# Patient Record
Sex: Male | Born: 1995 | Race: Black or African American | Hispanic: No | Marital: Single | Smoking: Never smoker
Health system: Southern US, Community
[De-identification: ages and names within clinical notes are randomized; demographics above are authoritative.]

## PROBLEM LIST (undated history)

## (undated) DIAGNOSIS — F909 Attention-deficit hyperactivity disorder, unspecified type: Secondary | ICD-10-CM

---
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
## 2019-01-13 ENCOUNTER — Encounter (HOSPITAL_COMMUNITY): Payer: Self-pay | Admitting: Emergency Medicine

## 2019-01-13 ENCOUNTER — Ambulatory Visit (HOSPITAL_COMMUNITY)
Admission: EM | Admit: 2019-01-13 | Discharge: 2019-01-13 | Disposition: A | Discharge status: Home / Self Care | Payer: Self-pay | Attending: Family Medicine | Admitting: Family Medicine

## 2019-01-13 DIAGNOSIS — L03032 Cellulitis of left toe: Secondary | ICD-10-CM | POA: Insufficient documentation

## 2019-01-13 MED ORDER — MUPIROCIN 2 % EX OINT: 1.0000 "application " | TOPICAL_OINTMENT | Freq: Two times a day (BID) | CUTANEOUS | 0 refills | Status: DC

## 2019-01-13 MED ORDER — CEPHALEXIN 500 MG PO CAPS: 500.0000 mg | ORAL_CAPSULE | Freq: Four times a day (QID) | ORAL | 0 refills | Status: AC

## 2019-01-13 NOTE — ED Provider Notes (Signed)
[place]    CSN: [id number] Arrival date & time: 01/13/19  2355     History   Chief Complaint Chief Complaint  Patient presents with  . Wound Check    HPI Chris Nelson is a 23 y.o. male is no past medical history presenting today for evaluation of toe infection.  Patient states that approximately 1 week ago he pulled a hangnail off.  Since he has developed redness, pain and drainage to his left great toe.  Denies previous issues of similar.  He is not applied anything to his toe or taking anything for his symptoms.  Denies any injury.  Denies difficulty moving ankle or other toes.  Denies numbness or tingling.  HPI  Past Medical History:  Diagnosis Date  . Premature birth     There are no active problems to display for this patient.   History reviewed. No pertinent surgical history.     Home Medications    Prior to Admission medications   Medication Sig Start Date End Date Taking? Authorizing Provider  atomoxetine (STRATTERA) 10 MG capsule Take 10 mg by mouth daily as needed. Takes only when school is in session.    [provider]  cephALEXin (KEFLEX) 500 MG capsule Take 1 capsule (500 mg total) by mouth 4 (four) times daily for 7 days. 01/13/19 01/20/19  Wieters, Hallie C, PA-C  mupirocin ointment (BACTROBAN) 2 % Apply 1 application topically 2 (two) times daily. 01/13/19   Wieters, Elesa Hacker, PA-C    Family History History reviewed. No pertinent family history.  Social History Social History   Tobacco Use  . Smoking status: Not on file  Substance Use Topics  . Alcohol use: Not on file  . Drug use: Not on file     Allergies   Patient has no known allergies.   Review of Systems Review of Systems  Constitutional: Negative for fatigue and fever.  Eyes: Negative for redness, itching and visual disturbance.  Respiratory: Negative for shortness of breath.   Cardiovascular: Negative for chest pain and leg swelling.  Gastrointestinal:  Negative for nausea and vomiting.  Musculoskeletal: Negative for arthralgias and myalgias.  Skin: Positive for color change and wound. Negative for rash.  Neurological: Negative for dizziness, syncope, weakness, light-headedness and headaches.     Physical Exam Triage Vital Signs ED Triage Vitals  Enc Vitals Group     BP 01/13/19 1012 (!) 153/82     Pulse Rate 01/13/19 1012 80     Resp 01/13/19 1012 18     Temp 01/13/19 1012 99 F (37.2 C)     Temp Source 01/13/19 1012 Temporal     SpO2 01/13/19 1012 100 %     Weight --      Height --      Head Circumference --      Peak Flow --      Pain Score 01/13/19 1013 7     Pain Loc --      Pain Edu? --      Excl. in [place]? --    No data found.  Updated Vital Signs BP (!) 153/82 (BP Location: Right Arm)   Pulse 80   Temp 99 F (37.2 C) (Temporal)   Resp 18   SpO2 100%   Visual Acuity Right Eye Distance:   Left Eye Distance:   Bilateral Distance:    Right Eye Near:   Left Eye Near:    Bilateral Near:     Physical  Exam Vitals signs and nursing note reviewed.  Constitutional:      Appearance: He is well-developed.     Comments: No acute distress  HENT:     Head: Normocephalic and atraumatic.     Nose: Nose normal.  Eyes:     Conjunctiva/sclera: Conjunctivae normal.  Neck:     Musculoskeletal: Neck supple.  Cardiovascular:     Rate and Rhythm: Normal rate.  Pulmonary:     Effort: Pulmonary effort is normal. No respiratory distress.  Abdominal:     General: There is no distension.  Musculoskeletal: Normal range of motion.     Comments: Dorsalis pedis 2+  Skin:    General: Skin is warm and dry.     Comments: See picture below, medial aspect of left great toe with large growth, black crusting around this, nail folds with erythema, tenderness and actively draining from proximal nailbed, tender to palpation around this area  Neurological:     Mental Status: He is alert and oriented to person, place, and time.          UC Treatments / Results  Labs (all labs ordered are listed, but only abnormal results are displayed) Labs Reviewed - No data to display  EKG None  Radiology No results found.  Procedures Procedures (including critical care time)  Medications Ordered in UC Medications - No data to display  Initial Impression / Assessment and Plan / UC Course  I have reviewed the triage vital signs and the nursing notes.  Pertinent labs & imaging results that were available during my care of the patient were reviewed by me and considered in my medical decision making (see chart for details).     Patient appears to have paronychia, likely secondary to overgrowth/ingrown toenail.  Will treat infection with Keflex and Bactroban.  Advised to do warm soaks twice daily to help soften up any crusting.  Follow-up with podiatry for further management of this toenail.  Follow-up here if symptoms worsening.Discussed strict return precautions. Patient verbalized understanding and is agreeable with plan.  Final Clinical Impressions(s) / UC Diagnoses   Final diagnoses:  Paronychia of great toe of left foot     Discharge Instructions     Please begin taking Keflex 4 times a day for the next week Please soak your foot in warm water for approximately 15 minutes twice daily, dry really well afterwards and apply Bactroban twice daily  Please follow-up if pain, redness and drainage not resolving Follow-up with podiatry for further management of the abnormality of your toe.   ED Prescriptions    Medication Sig Dispense Auth. Provider   cephALEXin (KEFLEX) 500 MG capsule Take 1 capsule (500 mg total) by mouth 4 (four) times daily for 7 days. 28 capsule Wieters, Hallie C, PA-C   mupirocin ointment (BACTROBAN) 2 % Apply 1 application topically 2 (two) times daily. 22 g Wieters, [place] C, PA-C     Controlled Substance Prescriptions [place] Controlled Substance Registry consulted? Not Applicable    Janith Lima, [place] 01/13/19 1047

## 2019-01-13 NOTE — Discharge Instructions (Signed)
Please begin taking Keflex 4 times a day for the next week Please soak your foot in warm water for approximately 15 minutes twice daily, dry really well afterwards and apply Bactroban twice daily  Please follow-up if pain, redness and drainage not resolving Follow-up with podiatry for further management of the abnormality of your toe.

## 2019-01-13 NOTE — ED Triage Notes (Signed)
Pt here for infection to left great toe after pulling "hang nail" off

## 2020-01-15 ENCOUNTER — Encounter (HOSPITAL_COMMUNITY): Payer: Self-pay

## 2020-01-15 ENCOUNTER — Ambulatory Visit (HOSPITAL_COMMUNITY)
Admission: EM | Admit: 2020-01-15 | Discharge: 2020-01-15 | Disposition: A | Discharge status: Home / Self Care | Payer: Medicaid Other | Attending: Family Medicine | Admitting: Family Medicine

## 2020-01-15 ENCOUNTER — Other Ambulatory Visit: Payer: Self-pay

## 2020-01-15 DIAGNOSIS — K0889 Other specified disorders of teeth and supporting structures: Secondary | ICD-10-CM

## 2020-01-15 MED ORDER — AMOXICILLIN-POT CLAVULANATE 875-125 MG PO TABS: 1.0000 | ORAL_TABLET | Freq: Two times a day (BID) | ORAL | 0 refills | Status: DC

## 2020-01-15 MED ORDER — HYDROCODONE-ACETAMINOPHEN 5-325 MG PO TABS: 1.0000 | ORAL_TABLET | Freq: Four times a day (QID) | ORAL | 0 refills | Status: DC | PRN

## 2020-01-15 NOTE — Discharge Instructions (Addendum)
Be aware, pain medications may cause drowsiness. Please do not drive, operate heavy machinery or make important decisions while on this medication, it can cloud your judgement.  

## 2020-01-15 NOTE — ED Triage Notes (Addendum)
Patient presents to Urgent Care with complaints of dental pain since two weeks ago. Patient reports he has been taking tylenol and ibuprofen, states his whole mouth hurts.  Significant swelling noted on the left side of pt's mouth, pt denies difficulty breathing or controlling secretions.

## 2020-01-20 NOTE — ED Provider Notes (Signed)
[place]   [id number] 01/15/20 Arrival Time: [id number]  ASSESSMENT & PLAN:  1. Pain, dental     Discussed possible sialoadenitis.  No sign of abscess requiring I&D at this time. Discussed.  Begin: Meds ordered this encounter  Medications  . amoxicillin-clavulanate (AUGMENTIN) 875-125 MG tablet    Sig: Take 1 tablet by mouth every 12 (twelve) hours.    Dispense:  20 tablet    Refill:  0  . HYDROcodone-acetaminophen (NORCO/VICODIN) 5-325 MG tablet    Sig: Take 1 tablet by mouth every 6 (six) hours as needed for moderate pain or severe pain.    Dispense:  8 tablet    Refill:  0    [place] Controlled Substances Registry consulted for this patient. I feel the risk/benefit ratio today is favorable for proceeding with this prescription for a controlled substance. Medication sedation precautions given.  Dental resource written instructions given. He will schedule dental evaluation as soon as possible if not improving over the next 24-48 hours.  Reviewed expectations re: course of current medical issues. Questions answered. Outlined signs and symptoms indicating need for more acute intervention. Patient verbalized understanding. After Visit Summary given.   SUBJECTIVE:  Chris Nelson is a 24 y.o. male who reports gradual onset of left lower dental pain described as aching. Present for the past 1-2 weeks. Fever: absent. Tolerating PO intake but reports pain with chewing. Normal swallowing. He does not see a dentist regularly. No neck swelling or pain. OTC analgesics without relief. Has noticed more swelling along L lower jaw the past few days.    OBJECTIVE: Vitals:   01/15/20 1727  BP: (!) 153/98  Pulse: (!) 104  Resp: 16  Temp: 98.7 F (37.1 C)  TempSrc: Oral  SpO2: 100%    General appearance: alert; no distress HENT: normocephalic; atraumatic; dentition: fair; left lower gums without areas of fluctuance, drainage, or bleeding and with tenderness to palpation;  significant swelling under L mandible; normal jaw movement without difficulty CV: slight tachycardia; regular Neck: supple without LAD; FROM; trachea midline Lungs: normal respirations; unlabored Skin: warm and dry Psychological: alert and cooperative; normal mood and affect  No Known Allergies  Past Medical History:  Diagnosis Date  . Premature birth    Social History   Socioeconomic History  . Marital status: Single    Spouse name: Not on file  . Number of children: Not on file  . Years of education: Not on file  . Highest education level: Not on file  Occupational History  . Not on file  Tobacco Use  . Smoking status: Never Smoker  . Smokeless tobacco: Never Used  Substance and Sexual Activity  . Alcohol use: Not Currently  . Drug use: Not on file  . Sexual activity: Not on file  Other Topics Concern  . Not on file  Social History Narrative  . Not on file   Social Determinants of Health   Financial Resource Strain:   . Difficulty of Paying Living Expenses: Not on file  Food Insecurity:   . Worried About Charity fundraiser in the Last Year: Not on file  . Ran Out of Food in the Last Year: Not on file  Transportation Needs:   . Lack of Transportation (Medical): Not on file  . Lack of Transportation (Non-Medical): Not on file  Physical Activity:   . Days of Exercise per Week: Not on file  . Minutes of Exercise per Session: Not on file  Stress:   .  Feeling of Stress : Not on file  Social Connections:   . Frequency of Communication with Friends and Family: Not on file  . Frequency of Social Gatherings with Friends and Family: Not on file  . Attends Religious Services: Not on file  . Active Member of Clubs or Organizations: Not on file  . Attends Archivist Meetings: Not on file  . Marital Status: Not on file  Intimate Partner Violence:   . Fear of Current or Ex-Partner: Not on file  . Emotionally Abused: Not on file  . Physically Abused: Not on  file  . Sexually Abused: Not on file   Family History  Problem Relation Age of Onset  . Hypertension Mother    History reviewed. No pertinent surgical history.   Vanessa Kick, MD 01/20/20 1015

## 2020-12-15 DIAGNOSIS — F3289 Other specified depressive episodes: Secondary | ICD-10-CM | POA: Diagnosis not present

## 2020-12-30 DIAGNOSIS — F3289 Other specified depressive episodes: Secondary | ICD-10-CM | POA: Diagnosis not present

## 2021-01-06 DIAGNOSIS — F3289 Other specified depressive episodes: Secondary | ICD-10-CM | POA: Diagnosis not present

## 2021-01-13 DIAGNOSIS — F3289 Other specified depressive episodes: Secondary | ICD-10-CM | POA: Diagnosis not present

## 2021-01-27 DIAGNOSIS — F3289 Other specified depressive episodes: Secondary | ICD-10-CM | POA: Diagnosis not present

## 2021-02-03 DIAGNOSIS — F3289 Other specified depressive episodes: Secondary | ICD-10-CM | POA: Diagnosis not present

## 2021-02-10 DIAGNOSIS — F3289 Other specified depressive episodes: Secondary | ICD-10-CM | POA: Diagnosis not present

## 2021-02-17 DIAGNOSIS — F3289 Other specified depressive episodes: Secondary | ICD-10-CM | POA: Diagnosis not present

## 2021-02-24 DIAGNOSIS — F3289 Other specified depressive episodes: Secondary | ICD-10-CM | POA: Diagnosis not present

## 2021-03-03 DIAGNOSIS — F3289 Other specified depressive episodes: Secondary | ICD-10-CM | POA: Diagnosis not present

## 2021-03-10 DIAGNOSIS — F3289 Other specified depressive episodes: Secondary | ICD-10-CM | POA: Diagnosis not present

## 2021-03-17 DIAGNOSIS — F3289 Other specified depressive episodes: Secondary | ICD-10-CM | POA: Diagnosis not present

## 2021-03-24 DIAGNOSIS — F3289 Other specified depressive episodes: Secondary | ICD-10-CM | POA: Diagnosis not present

## 2021-03-31 DIAGNOSIS — F3289 Other specified depressive episodes: Secondary | ICD-10-CM | POA: Diagnosis not present

## 2021-04-14 DIAGNOSIS — F3289 Other specified depressive episodes: Secondary | ICD-10-CM | POA: Diagnosis not present

## 2021-04-21 DIAGNOSIS — F3289 Other specified depressive episodes: Secondary | ICD-10-CM | POA: Diagnosis not present

## 2021-05-05 DIAGNOSIS — F3289 Other specified depressive episodes: Secondary | ICD-10-CM | POA: Diagnosis not present

## 2021-05-12 DIAGNOSIS — F3289 Other specified depressive episodes: Secondary | ICD-10-CM | POA: Diagnosis not present

## 2021-05-26 DIAGNOSIS — F3289 Other specified depressive episodes: Secondary | ICD-10-CM | POA: Diagnosis not present

## 2021-06-02 DIAGNOSIS — F3289 Other specified depressive episodes: Secondary | ICD-10-CM | POA: Diagnosis not present

## 2021-06-23 DIAGNOSIS — F3289 Other specified depressive episodes: Secondary | ICD-10-CM | POA: Diagnosis not present

## 2021-06-30 DIAGNOSIS — F3289 Other specified depressive episodes: Secondary | ICD-10-CM | POA: Diagnosis not present

## 2021-07-07 DIAGNOSIS — F3289 Other specified depressive episodes: Secondary | ICD-10-CM | POA: Diagnosis not present

## 2021-07-14 DIAGNOSIS — F3289 Other specified depressive episodes: Secondary | ICD-10-CM | POA: Diagnosis not present

## 2021-07-21 DIAGNOSIS — F3289 Other specified depressive episodes: Secondary | ICD-10-CM | POA: Diagnosis not present

## 2021-07-28 DIAGNOSIS — F3289 Other specified depressive episodes: Secondary | ICD-10-CM | POA: Diagnosis not present

## 2021-08-04 DIAGNOSIS — F3289 Other specified depressive episodes: Secondary | ICD-10-CM | POA: Diagnosis not present

## 2021-08-05 ENCOUNTER — Telehealth: Payer: Self-pay

## 2021-08-05 NOTE — Telephone Encounter (Signed)
.  Mr. Chris Nelson, Chris Nelson are scheduled for a virtual visit with your provider today.    Just as we do with appointments in the office, we must obtain your consent to participate.  Your consent will be active for this visit and any virtual visit you may have with one of our providers in the next 365 days.    If you have a MyChart account, I can also send a copy of this consent to you electronically.  All virtual visits are billed to your insurance company just like a traditional visit in the office.  As this is a virtual visit, video technology does not allow for your provider to perform a traditional examination.  This may limit your provider's ability to fully assess your condition.  If your provider identifies any concerns that need to be evaluated in person or the need to arrange testing such as labs, EKG, etc, we will make arrangements to do so.    Although advances in technology are sophisticated, we cannot ensure that it will always work on either your end or our end.  If the connection with a video visit is poor, we may have to switch to a telephone visit.  With either a video or telephone visit, we are not always able to ensure that we have a secure connection.   I need to obtain your verbal consent now.   Are you willing to proceed with your visit today?   Chris Nelson has provided verbal consent on 08/05/2021 for a virtual visit (video or telephone).   Chris Nelson 08/05/2021  10:51 AM

## 2021-08-06 NOTE — Progress Notes (Signed)
Virtual Visit via Telephone Note  I connected with Chris Nelson, on 08/08/2021 at 11:50 AM by telephone due to the COVID-19 pandemic and verified that I am speaking with the correct person using two identifiers.  Due to current restrictions/limitations of in-office visits due to the COVID-19 pandemic, this scheduled clinical appointment was converted to a telehealth visit.   Consent: I discussed the limitations, risks, security and privacy concerns of performing an evaluation and management service by telephone and the availability of in person appointments. I also discussed with the patient that there may be a patient responsible charge related to this service. The patient expressed understanding and agreed to proceed.   Location of Patient: Home  Location of Provider: [facility] [place] participating in Telemedicine visit: [place], NP Elmon Else, CMA   History of Present Illness: Chris Nelson is a 25 year-old male who presents to establish care.   Current issues and/or concerns: None     Past Medical History:  Diagnosis Date   Premature birth    No Known Allergies  Current Outpatient Medications on File Prior to Visit  Medication Sig Dispense Refill   amoxicillin-clavulanate (AUGMENTIN) 875-125 MG tablet Take 1 tablet by mouth every 12 (twelve) hours. 20 tablet 0   atomoxetine (STRATTERA) 10 MG capsule Take 10 mg by mouth daily as needed. Takes only when school is in session.     HYDROcodone-acetaminophen (NORCO/VICODIN) 5-325 MG tablet Take 1 tablet by mouth every 6 (six) hours as needed for moderate pain or severe pain. 8 tablet 0   mupirocin ointment (BACTROBAN) 2 % Apply 1 application topically 2 (two) times daily. 22 g 0   No current facility-administered medications on file prior to visit.    Observations/Objective: Alert and oriented x 3. Not in acute distress. Physical examination not completed as  this is a telemedicine visit.  Assessment and Plan: 1. Encounter to establish care: - Patient presents today to establish care.  - Return for annual physical examination, labs, and health maintenance. Arrive fasting meaning having no food for at least 8 hours prior to appointment. You may have only water or black coffee. Please take scheduled medications as normal.    Follow Up Instructions: Return for annual physical exam.   Patient was given clear instructions to go to Emergency Department or return to medical center if symptoms don't improve, worsen, or new problems develop.The patient verbalized understanding.  I discussed the assessment and treatment plan with the patient. The patient was provided an opportunity to ask questions and all were answered. The patient agreed with the plan and demonstrated an understanding of the instructions.   The patient was advised to call back or seek an in-person evaluation if the symptoms worsen or if the condition fails to improve as anticipated.    I provided 5 minutes total of non-face-to-face time during this encounter.   Camillia Herter, NP  [facility] [place], [place] 08/08/2021, 11:50 AM

## 2021-08-08 ENCOUNTER — Telehealth (INDEPENDENT_AMBULATORY_CARE_PROVIDER_SITE_OTHER): Payer: Medicaid Other | Admitting: Family

## 2021-08-08 ENCOUNTER — Other Ambulatory Visit: Payer: Self-pay

## 2021-08-08 DIAGNOSIS — Z7689 Persons encountering health services in other specified circumstances: Secondary | ICD-10-CM | POA: Diagnosis not present

## 2021-08-11 DIAGNOSIS — F3289 Other specified depressive episodes: Secondary | ICD-10-CM | POA: Diagnosis not present

## 2021-08-18 DIAGNOSIS — F3289 Other specified depressive episodes: Secondary | ICD-10-CM | POA: Diagnosis not present

## 2021-08-25 DIAGNOSIS — F3289 Other specified depressive episodes: Secondary | ICD-10-CM | POA: Diagnosis not present

## 2021-09-01 DIAGNOSIS — F3289 Other specified depressive episodes: Secondary | ICD-10-CM | POA: Diagnosis not present

## 2021-09-05 ENCOUNTER — Encounter: Payer: Medicaid Other | Admitting: Family

## 2021-09-08 DIAGNOSIS — F3289 Other specified depressive episodes: Secondary | ICD-10-CM | POA: Diagnosis not present

## 2021-09-08 NOTE — Progress Notes (Signed)
Patient ID: HISAO DOO, male    DOB: [birth year]  MRN: [id number]  CC: Annual Physical Exam  Subjective: Chris Nelson is a 25 y.o. male who presents for annual physical exam. He is accompanied by his mother, Judge Stall, who serves as part historian.  His concerns today include:  Reports there is a lump growing on right upper back first noticed 3 months ago. Reports has increased in size since that time. Denies pain, drainage, injury and trauma.   Reports seen over 1 year ago for left great ingrown toenail with infection. Was given antibiotic pills and antibiotic cream at that time. Wants to make sure ok. .  Reports intermittent headaches. Endorses significant screen time. Wears corrective lenses and last eye exam around 2018. Denies any red flag symptoms. Taking ibuprofen usually helps.   Reports diffuse eczema-like rash on generalized body. Denies itching.   Back pain sometimes. Endorses that he does infrequent exercise.   Mother reports patient was a twin. Reports patient's kidneys are located on front of abdomen. Was followed by kidney doctor in the past has been several years since last visit. Reports was always told by pediatricians that he was doing well in regards to kidney concerns.  There are no problems to display for this patient.    Current Outpatient Medications on File Prior to Visit  Medication Sig Dispense Refill   atomoxetine (STRATTERA) 10 MG capsule Take 10 mg by mouth daily as needed. Takes only when school is in session.     No current facility-administered medications on file prior to visit.    No Known Allergies  Social History   Socioeconomic History   Marital status: Single    Spouse name: Not on file   Number of children: Not on file   Years of education: Not on file   Highest education level: Not on file  Occupational History   Not on file  Tobacco Use   Smoking status: Never   Smokeless tobacco: Never  Vaping Use   Vaping Use: Never used   Substance and Sexual Activity   Alcohol use: Not Currently   Drug use: Never   Sexual activity: Not on file  Other Topics Concern   Not on file  Social History Narrative   Not on file   Social Determinants of Health   Financial Resource Strain: Not on file  Food Insecurity: Not on file  Transportation Needs: Not on file  Physical Activity: Not on file  Stress: Not on file  Social Connections: Not on file  Intimate Partner Violence: Not on file    Family History  Problem Relation Age of Onset   Hypertension Mother     History reviewed. No pertinent surgical history.  ROS: Review of Systems Negative except as stated above  PHYSICAL EXAM: BP 130/88 (BP Location: Left Arm, Patient Position: Sitting, Cuff Size: Normal)   Pulse 82   Temp 97.9 F (36.6 C)   Resp 18   Ht 5' 8.78" (1.747 m)   Wt 238 lb 3.2 oz (108 kg)   SpO2 97%   BMI 35.40 kg/m   Physical Exam HENT:     Head: Normocephalic and atraumatic.     Right Ear: Tympanic membrane, ear canal and external ear normal.     Left Ear: Tympanic membrane, ear canal and external ear normal.  Eyes:     Extraocular Movements: Extraocular movements intact.     Conjunctiva/sclera: Conjunctivae normal.     Pupils: Pupils are equal,  round, and reactive to light.  Cardiovascular:     Rate and Rhythm: Normal rate and regular rhythm.     Pulses: Normal pulses.     Heart sounds: Normal heart sounds.  Pulmonary:     Effort: Pulmonary effort is normal.     Breath sounds: Normal breath sounds.  Abdominal:     General: Bowel sounds are normal.     Palpations: Abdomen is soft.  Genitourinary:    Comments: Patient declined exam.  Musculoskeletal:        General: Normal range of motion.     Cervical back: Normal range of motion and neck supple.     Comments: Previously left great toenail partially removed related to hang nail. Bare nailbed remaining with mild erythema.   Skin:    General: Skin is warm and dry.      Capillary Refill: Capillary refill takes less than 2 seconds.     Findings: Rash present.     Comments: Rash similar in appearance to eczema of diffuse generalized body chronic in nature.  Neurological:     General: No focal deficit present.     Mental Status: He is alert and oriented to person, place, and time.  Psychiatric:        Mood and Affect: Mood normal.        Behavior: Behavior normal.    ASSESSMENT AND PLAN: 1. Annual physical exam: - Counseled on 150 minutes of exercise per week as tolerated, healthy eating (including decreased daily intake of saturated fats, cholesterol, added sugars, sodium), STI prevention, and routine healthcare maintenance.  2. Screening for metabolic disorder: - WFU93+ATFT to check kidney function, liver function, and electrolyte balance.  - CMP14+EGFR  3. Screening for deficiency anemia: - CBC to screen for anemia. - CBC  4. Diabetes mellitus screening: - Hemoglobin A1c to screen for pre-diabetes/diabetes. - Hemoglobin A1c  5. Screening cholesterol level: - Lipid panel to screen for high cholesterol.  - Lipid panel  6. Thyroid disorder screen: - TSH to check thyroid function.  - TSH  7. Need for hepatitis C screening test: - Hepatitis C antibody to screen for hepatitis C.  - Hepatitis C Antibody  8. Encounter for screening for HIV: - HIV antibody to screen for human immunodeficiency virus.  - HIV antibody (with reflex)  9. Lipoma of torso: - Referral to Plastic Surgery for further evaluation and management.  - Follow-up with primary provider as scheduled.  - Ambulatory referral to Plastic Surgery  10. Paronychia of great toe, left: - Mupirocin cream as prescribed.  - Referral to Podiatry for further evaluation and management.  - Follow-up with primary provider as scheduled.  - mupirocin cream (BACTROBAN) 2 %; Apply 1 application topically 2 (two) times daily.  Dispense: 30 g; Refill: 2 - Ambulatory referral to Podiatry  11.  Chronic nonintractable headache, unspecified headache type: - No evidence of red flag symptoms present. Continue over-the-counter Ibuprofen and Acetaminophen. Encouraged to schedule annual eye exam, already established with an external provider.  - Follow-up with primary provider as scheduled.   12. Rash and nonspecific skin eruption: - Referral to Dermatology for further evaluation and management.  - Ambulatory referral to Dermatology    Patient was given the opportunity to ask questions.  Patient verbalized understanding of the plan and was able to repeat key elements of the plan. Patient was given clear instructions to go to Emergency Department or return to medical center if symptoms don't improve, worsen, or new problems develop.The  patient verbalized understanding.   Orders Placed This Encounter  Procedures   HIV antibody (with reflex)   Hepatitis C Antibody   CBC   Lipid panel   TSH   CMP14+EGFR   Hemoglobin A1c   Ambulatory referral to Podiatry   Ambulatory referral to Plastic Surgery   Ambulatory referral to Dermatology     Requested Prescriptions   Signed Prescriptions Disp Refills   mupirocin cream (BACTROBAN) 2 % 30 g 2    Sig: Apply 1 application topically 2 (two) times daily.   Follow-up with primary provider as scheduled.   Camillia Herter, NP

## 2021-09-09 ENCOUNTER — Other Ambulatory Visit: Payer: Self-pay

## 2021-09-12 ENCOUNTER — Ambulatory Visit (INDEPENDENT_AMBULATORY_CARE_PROVIDER_SITE_OTHER): Payer: Medicaid Other | Admitting: Family

## 2021-09-12 ENCOUNTER — Other Ambulatory Visit: Payer: Self-pay

## 2021-09-12 ENCOUNTER — Encounter: Payer: Self-pay | Admitting: Family

## 2021-09-12 VITALS — BP 130/88 | HR 82 | Temp 97.9°F | Resp 18 | Ht 68.78 in | Wt 238.2 lb

## 2021-09-12 DIAGNOSIS — D171 Benign lipomatous neoplasm of skin and subcutaneous tissue of trunk: Secondary | ICD-10-CM

## 2021-09-12 DIAGNOSIS — Z0000 Encounter for general adult medical examination without abnormal findings: Secondary | ICD-10-CM

## 2021-09-12 DIAGNOSIS — Z1159 Encounter for screening for other viral diseases: Secondary | ICD-10-CM

## 2021-09-12 DIAGNOSIS — R519 Headache, unspecified: Secondary | ICD-10-CM | POA: Diagnosis not present

## 2021-09-12 DIAGNOSIS — L03032 Cellulitis of left toe: Secondary | ICD-10-CM | POA: Diagnosis not present

## 2021-09-12 DIAGNOSIS — G8929 Other chronic pain: Secondary | ICD-10-CM

## 2021-09-12 DIAGNOSIS — Z131 Encounter for screening for diabetes mellitus: Secondary | ICD-10-CM | POA: Diagnosis not present

## 2021-09-12 DIAGNOSIS — Z1329 Encounter for screening for other suspected endocrine disorder: Secondary | ICD-10-CM | POA: Diagnosis not present

## 2021-09-12 DIAGNOSIS — Z130 Encounter for screening for diseases of the blood and blood-forming organs and certain disorders involving the immune mechanism: Secondary | ICD-10-CM | POA: Diagnosis not present

## 2021-09-12 DIAGNOSIS — Z13228 Encounter for screening for other metabolic disorders: Secondary | ICD-10-CM | POA: Diagnosis not present

## 2021-09-12 DIAGNOSIS — Z114 Encounter for screening for human immunodeficiency virus [HIV]: Secondary | ICD-10-CM

## 2021-09-12 DIAGNOSIS — R21 Rash and other nonspecific skin eruption: Secondary | ICD-10-CM

## 2021-09-12 DIAGNOSIS — Z13220 Encounter for screening for lipoid disorders: Secondary | ICD-10-CM

## 2021-09-12 MED ORDER — MUPIROCIN CALCIUM 2 % EX CREA: 1.0000 "application " | TOPICAL_CREAM | Freq: Two times a day (BID) | CUTANEOUS | 2 refills | Status: DC

## 2021-09-12 NOTE — Progress Notes (Signed)
Pt presents for annual physical exam, accompanied by mother Carleen ,pt reports lump on back, growing in size started approx 3 months ago, denies any pain Left foot big toe pulled hang nail

## 2021-09-12 NOTE — Patient Instructions (Signed)
Preventive Care 36-23 Years Old, Male Preventive care refers to lifestyle choices and visits with your health care provider that can promote health and wellness. This includes: A yearly physical exam. This is also called an annual wellness visit. Regular dental and eye exams. Immunizations. Screening for certain conditions. Healthy lifestyle choices, such as: Eating a healthy diet. Getting regular exercise. Not using drugs or products that contain nicotine and tobacco. Limiting alcohol use. What can I expect for my preventive care visit? Physical exam Your health care provider may check your: Height and weight. These may be used to calculate your BMI (body mass index). BMI is a measurement that tells if you are at a healthy weight. Heart rate and blood pressure. Body temperature. Skin for abnormal spots. Counseling Your health care provider may ask you questions about your: Past medical problems. Family's medical history. Alcohol, tobacco, and drug use. Emotional well-being. Home life and relationship well-being. Sexual activity. Diet, exercise, and sleep habits. Work and work Statistician. Access to firearms. What immunizations do I need? Vaccines are usually given at various ages, according to a schedule. Your health care provider will recommend vaccines for you based on your age, medical history, and lifestyle or other factors, such as travel or where you work. What tests do I need? Blood tests Lipid and cholesterol levels. These may be checked every 5 years starting at age 61. Hepatitis C test. Hepatitis B test. Screening  Diabetes screening. This is done by checking your blood sugar (glucose) after you have not eaten for a while (fasting). Genital exam to check for testicular cancer or hernias. STD (sexually transmitted disease) testing, if you are at risk. Talk with your health care provider about your test results, treatment options, and if necessary, the need for more  tests. Follow these instructions at home: Eating and drinking  Eat a healthy diet that includes fresh fruits and vegetables, whole grains, lean protein, and low-fat dairy products. Drink enough fluid to keep your urine pale yellow. Take vitamin and mineral supplements as recommended by your health care provider. Do not drink alcohol if your health care provider tells you not to drink. If you drink alcohol: Limit how much you have to 0-2 drinks a day. Be aware of how much alcohol is in your drink. In the [place] one drink equals one 12 oz bottle of beer (355 mL), one 5 oz glass of wine (148 mL), or one 1 oz glass of hard liquor (44 mL). Lifestyle Take daily care of your teeth and gums. Brush your teeth every morning and night with fluoride toothpaste. Floss one time each day. Stay active. Exercise for at least 30 minutes 5 or more days each week. Do not use any products that contain nicotine or tobacco, such as cigarettes, e-cigarettes, and chewing tobacco. If you need help quitting, ask your health care provider. Do not use drugs. If you are sexually active, practice safe sex. Use a condom or other form of protection to prevent STIs (sexually transmitted infections). Find healthy ways to cope with stress, such as: Meditation, yoga, or listening to music. Journaling. Talking to a trusted person. Spending time with friends and family. Safety Always wear your seat belt while driving or riding in a vehicle. Do not drive: If you have been drinking alcohol. Do not ride with someone who has been drinking. When you are tired or distracted. While texting. Wear a helmet and other protective equipment during sports activities. If you have firearms in your house, make sure  you follow all gun safety procedures. Seek help if you have been physically or sexually abused. What's next? Go to your health care provider once a year for an annual wellness visit. Ask your health care provider how often you  should have your eyes and teeth checked. Stay up to date on all vaccines. This information is not intended to replace advice given to you by your health care provider. Make sure you discuss any questions you have with your health care provider. Document Revised: 02/18/2021 Document Reviewed: 12/05/2018 Elsevier Patient Education  2022 [employer].

## 2021-09-13 ENCOUNTER — Encounter: Payer: Self-pay | Admitting: Family

## 2021-09-13 DIAGNOSIS — R7303 Prediabetes: Secondary | ICD-10-CM | POA: Insufficient documentation

## 2021-09-13 LAB — LIPID PANEL
Chol/HDL Ratio: 3.8 ratio (ref 0.0–5.0)
Cholesterol, Total: 186 mg/dL (ref 100–199)
HDL: 49 mg/dL (ref >39)
LDL Chol Calc (NIH): 118 mg/dL — ABNORMAL HIGH (ref 0–99)
Triglycerides: 103 mg/dL (ref 0–149)
VLDL Cholesterol Cal: 19 mg/dL (ref 5–40)

## 2021-09-13 LAB — HIV ANTIBODY (ROUTINE TESTING W REFLEX): HIV Screen 4th Generation wRfx: NONREACTIVE

## 2021-09-13 LAB — TSH: TSH: 0.914 u[IU]/mL (ref 0.450–4.500)

## 2021-09-13 LAB — CMP14+EGFR
ALT: 58 IU/L — ABNORMAL HIGH (ref 0–44)
AST: 38 IU/L (ref 0–40)
Albumin/Globulin Ratio: 1.8 (ref 1.2–2.2)
Albumin: 4.7 g/dL (ref 4.1–5.2)
Alkaline Phosphatase: 61 IU/L (ref 44–121)
BUN/Creatinine Ratio: 10 (ref 9–20)
BUN: 13 mg/dL (ref 6–20)
Bilirubin Total: 1.2 mg/dL (ref 0.0–1.2)
CO2: 26 mmol/L (ref 20–29)
Calcium: 10.3 mg/dL — ABNORMAL HIGH (ref 8.7–10.2)
Chloride: 100 mmol/L (ref 96–106)
Creatinine, Ser: 1.34 mg/dL — ABNORMAL HIGH (ref 0.76–1.27)
Globulin, Total: 2.6 g/dL (ref 1.5–4.5)
Glucose: 86 mg/dL (ref 65–99)
Potassium: 4.8 mmol/L (ref 3.5–5.2)
Sodium: 141 mmol/L (ref 134–144)
Total Protein: 7.3 g/dL (ref 6.0–8.5)
eGFR: 76 mL/min/{1.73_m2} (ref >59)

## 2021-09-13 LAB — CBC
Hematocrit: 47.1 % (ref 37.5–51.0)
Hemoglobin: 15.5 g/dL (ref 13.0–17.7)
MCH: 28 pg (ref 26.6–33.0)
MCHC: 32.9 g/dL (ref 31.5–35.7)
MCV: 85 fL (ref 79–97)
Platelets: 196 10*3/uL (ref 150–450)
RBC: 5.53 x10E6/uL (ref 4.14–5.80)
RDW: 11.9 % (ref 11.6–15.4)
WBC: 4.3 10*3/uL (ref 3.4–10.8)

## 2021-09-13 LAB — HEMOGLOBIN A1C
Est. average glucose Bld gHb Est-mCnc: 120 mg/dL
Hgb A1c MFr Bld: 5.8 % — ABNORMAL HIGH (ref 4.8–5.6)

## 2021-09-13 LAB — HEPATITIS C ANTIBODY: Hep C Virus Ab: <0.1 s/co ratio (ref 0.0–0.9)

## 2021-09-13 NOTE — Progress Notes (Signed)
Kidney function normal. Reminder to remain hydrated with water.   Thyroid function normal.   No anemia.   Hepatitis C negative.   HIV negative.   ALT elevated. This enzyme is used to check liver function.   Some causes of an elevation of ALT may be increased alcohol consumption, high-fat diet, or overuse of NSAID's such as Ibuprofen, Aleve, Motrin, and Naproxen. Please note these may not apply to you and only serve as examples.   Encouraged to recheck liver function in 4 to 6 weeks at lab only visit. Please call our office to schedule an appointment for this.   LDL cholesterol, sometimes called "bad cholesterol",  higher than expected. High cholesterol may increase risk of heart attack and/or stroke. Consider eating more fruits, vegetables, and lean baked meats such as chicken or fish. Moderate intensity exercise at least 150 minutes as tolerated per week may help as well. No medication needed at the moment. Encouraged to recheck at lab only visit in 3 to 6 months. Please call our office to schedule an appointment for this.   Hemoglobin A1c is consistent with pre-diabetes. Practice healthy eating habits of fresh fruit and vegetables, lean baked meats such as chicken, fish, and [place]; limit breads, rice, pastas, and desserts; practice regular aerobic exercise (at least 150 minutes a week as tolerated). No medication needed at the moment. Encouraged to recheck in 6 months. Please call our office to schedule an appointment for this.

## 2021-09-15 DIAGNOSIS — F3289 Other specified depressive episodes: Secondary | ICD-10-CM | POA: Diagnosis not present

## 2021-09-22 DIAGNOSIS — F3289 Other specified depressive episodes: Secondary | ICD-10-CM | POA: Diagnosis not present

## 2021-09-29 DIAGNOSIS — F3289 Other specified depressive episodes: Secondary | ICD-10-CM | POA: Diagnosis not present

## 2021-10-06 DIAGNOSIS — F3289 Other specified depressive episodes: Secondary | ICD-10-CM | POA: Diagnosis not present

## 2021-10-13 DIAGNOSIS — F3289 Other specified depressive episodes: Secondary | ICD-10-CM | POA: Diagnosis not present

## 2021-10-20 DIAGNOSIS — F3289 Other specified depressive episodes: Secondary | ICD-10-CM | POA: Diagnosis not present

## 2021-10-27 DIAGNOSIS — F3289 Other specified depressive episodes: Secondary | ICD-10-CM | POA: Diagnosis not present

## 2021-10-31 ENCOUNTER — Ambulatory Visit (INDEPENDENT_AMBULATORY_CARE_PROVIDER_SITE_OTHER): Payer: Medicaid Other | Admitting: Plastic Surgery

## 2021-10-31 ENCOUNTER — Other Ambulatory Visit: Payer: Self-pay

## 2021-10-31 ENCOUNTER — Encounter: Payer: Self-pay | Admitting: Plastic Surgery

## 2021-10-31 VITALS — BP 146/91 | HR 75 | Ht 68.0 in | Wt 243.6 lb

## 2021-10-31 DIAGNOSIS — D179 Benign lipomatous neoplasm, unspecified: Secondary | ICD-10-CM | POA: Diagnosis not present

## 2021-10-31 NOTE — Progress Notes (Signed)
   Referring Provider Camillia Herter, NP [address],  [place] [address]   CC:  Right back fatty mass.  Chris Nelson is an 25 y.o. male.  HPI: The patient is a 25 year old with a history of a right back fatty mass since June 2022.  He notes that it has grown slightly larger recently.  He has not had any imaging.  Patient denies any tobacco use.  Patient is otherwise relatively healthy.  No history of diabetes or hypertension.  No Known Allergies  Outpatient Encounter Medications as of 10/31/2021  Medication Sig   atomoxetine (STRATTERA) 10 MG capsule Take 10 mg by mouth daily as needed. Takes only when school is in session.   mupirocin cream (BACTROBAN) 2 % Apply 1 application topically 2 (two) times daily.   No facility-administered encounter medications on file as of 10/31/2021.     Past Medical History:  Diagnosis Date   Premature birth     No past surgical history on file.  Family History  Problem Relation Age of Onset   Hypertension Mother     SH:  no tobacco, no illicit drug use.   Review of Systems General: Denies fevers, chills, weight loss CV: Denies chest pain, shortness of breath, palpitations   Physical Exam Vitals with BMI 10/31/2021 09/12/2021 01/15/2020  Height 5\' 8"  5' 8.78" -  Weight 243 lbs 10 oz 238 lbs 3 oz -  BMI 89.79 15.0 -  Systolic [phone]  Diastolic 91 88 98  Pulse 75 82 104    General:  No acute distress,  Alert and oriented, Non-Toxic, Normal speech and affect Ext: Right side of the back with 17 x 14 cm fatty mass over the region of the latissimus.  Assessment/Plan The patient has a right back fatty mass.  The most likely diagnosis is lipoma but given the large size and lack of clear border would like to image this lesion with MRI to make sure there are no concerning features for malignancy.  After imaging I will most likely recommend excision.  I discussed this plan with the patient and he is in  agreement.  Time based coding:  20 minutes were spent with the patient.  Time was spent reviewing records, discussing surgical procedures with the patient and reviewing risks and benefits.   [name] 10/31/2021, 11:10 AM

## 2021-11-10 DIAGNOSIS — F3289 Other specified depressive episodes: Secondary | ICD-10-CM | POA: Diagnosis not present

## 2021-11-15 ENCOUNTER — Ambulatory Visit (HOSPITAL_COMMUNITY): Payer: Medicaid Other

## 2021-11-21 ENCOUNTER — Ambulatory Visit (HOSPITAL_COMMUNITY): Admission: RE | Admit: 2021-11-21 | Payer: Medicaid Other | Source: Ambulatory Visit

## 2021-11-24 DIAGNOSIS — F3289 Other specified depressive episodes: Secondary | ICD-10-CM | POA: Diagnosis not present

## 2021-11-28 ENCOUNTER — Other Ambulatory Visit: Payer: Self-pay

## 2021-11-28 ENCOUNTER — Ambulatory Visit (HOSPITAL_COMMUNITY)
Admission: RE | Admit: 2021-11-28 | Discharge: 2021-11-28 | Disposition: A | Discharge status: Home / Self Care | Payer: Medicaid Other | Source: Ambulatory Visit | Attending: Plastic Surgery | Admitting: Plastic Surgery

## 2021-11-28 DIAGNOSIS — D179 Benign lipomatous neoplasm, unspecified: Secondary | ICD-10-CM

## 2021-11-28 DIAGNOSIS — R222 Localized swelling, mass and lump, trunk: Secondary | ICD-10-CM | POA: Diagnosis not present

## 2021-11-28 DIAGNOSIS — D1779 Benign lipomatous neoplasm of other sites: Secondary | ICD-10-CM | POA: Diagnosis not present

## 2021-11-28 DIAGNOSIS — D171 Benign lipomatous neoplasm of skin and subcutaneous tissue of trunk: Secondary | ICD-10-CM | POA: Diagnosis not present

## 2021-11-28 MED ORDER — GADOBUTROL 1 MMOL/ML IV SOLN
10.0000 mL | Freq: Once | INTRAVENOUS | Status: AC | PRN
  Administered 2021-11-28: 10 mL via INTRAVENOUS

## 2021-11-30 ENCOUNTER — Encounter: Payer: Self-pay | Admitting: Family

## 2021-12-01 DIAGNOSIS — F3289 Other specified depressive episodes: Secondary | ICD-10-CM | POA: Diagnosis not present

## 2021-12-07 ENCOUNTER — Encounter: Payer: Self-pay | Admitting: Family

## 2021-12-08 DIAGNOSIS — F3289 Other specified depressive episodes: Secondary | ICD-10-CM | POA: Diagnosis not present

## 2021-12-15 DIAGNOSIS — F3289 Other specified depressive episodes: Secondary | ICD-10-CM | POA: Diagnosis not present

## 2022-01-02 ENCOUNTER — Ambulatory Visit (INDEPENDENT_AMBULATORY_CARE_PROVIDER_SITE_OTHER): Payer: Medicaid Other

## 2022-01-02 ENCOUNTER — Ambulatory Visit (INDEPENDENT_AMBULATORY_CARE_PROVIDER_SITE_OTHER): Payer: Medicaid Other | Admitting: Podiatry

## 2022-01-02 ENCOUNTER — Other Ambulatory Visit: Payer: Self-pay

## 2022-01-02 DIAGNOSIS — S91102A Unspecified open wound of left great toe without damage to nail, initial encounter: Secondary | ICD-10-CM

## 2022-01-02 DIAGNOSIS — L600 Ingrowing nail: Secondary | ICD-10-CM

## 2022-01-02 DIAGNOSIS — E11621 Type 2 diabetes mellitus with foot ulcer: Secondary | ICD-10-CM | POA: Diagnosis not present

## 2022-01-02 DIAGNOSIS — S91109A Unspecified open wound of unspecified toe(s) without damage to nail, initial encounter: Secondary | ICD-10-CM | POA: Diagnosis not present

## 2022-01-02 DIAGNOSIS — L97529 Non-pressure chronic ulcer of other part of left foot with unspecified severity: Secondary | ICD-10-CM

## 2022-01-02 MED ORDER — DOXYCYCLINE HYCLATE 100 MG PO TABS: 100.0000 mg | ORAL_TABLET | Freq: Two times a day (BID) | ORAL | 0 refills | Status: DC

## 2022-01-02 MED ORDER — GENTAMICIN SULFATE 0.1 % EX CREA: 1.0000 "application " | TOPICAL_CREAM | Freq: Three times a day (TID) | CUTANEOUS | 0 refills | Status: DC

## 2022-01-05 DIAGNOSIS — F3289 Other specified depressive episodes: Secondary | ICD-10-CM | POA: Diagnosis not present

## 2022-01-05 LAB — WOUND CULTURE
MICRO NUMBER:: 12845053
SPECIMEN QUALITY:: ADEQUATE

## 2022-01-08 NOTE — Progress Notes (Addendum)
° °  HPI: 26 y.o. male presenting today with his mother as a new patient for evaluation of a chronic ingrown toenail to the left great toe.  Patient states that this has been present for about 2 years now.  He has gone to different doctors in the past who recommended and prescribed antibiotics but it has not resolved any of his symptoms to the left great toe.  He presents for further treatment and evaluation  Past Medical History:  Diagnosis Date   Premature birth     No past surgical history on file.  No Known Allergies      Physical Exam: General: The patient is alert and oriented x3 in no acute distress.  Dermatology: With evaluation today it appears that the left great toenail plate is actually absent.  There appears to be an open wound to the area where the nail plate would be.  Hypergranular tissue with callus and serous sanguinous drainage noted to the toe.  No significant malodor.  Vascular: Palpable pedal pulses bilaterally. Capillary refill within normal limits.  Negative for any significant edema or erythema  Neurological: Light touch and protective threshold grossly intact  Musculoskeletal Exam: No pedal deformities noted.  There is some associated tenderness to palpation to the entire distal portion of the left hallux  Radiographic Exam:  Normal osseous mineralization. Joint spaces preserved. No fracture/dislocation/boney destruction.  There is no osseous erosion or evidence of osteomyelitis to the distal phalanx of the left hallux  Assessment: 1.  Chronic severe ingrown toenail left hallux x2 years 2.  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 Patient evaluated. X-Rays reviewed.  2.  Since the patient has had a severe ingrown toenail for about 2 years now I do believe that surgical intervention is warranted. 3.  Culture was taken and sent to pathology for culture and sensitivity 4.  Prescription for doxycycline 100 mg 2 times daily #20 5.   Prescription for gentamicin cream applied daily 6.  The patient understands and agrees as well as his mother that we need to be aggressive with this since he has dealt with this for 2 years and there is some concern for chronic infection which can lead to more proximal limb loss.  Today we discussed the conservative versus surgical management of the presenting pathology. The patient opts for surgical management. All possible complications and details of the procedure were explained. All patient questions were answered. No guarantees were expressed or implied. 7. Authorization for surgery was initiated today. Surgery will consist of distal terminal Symes amputation left hallux 8.  Return to clinic 1 week postop       Edrick Kins, DPM [facility]  Dr. Edrick Kins, DPM    [address], [place] [address]                Office [phone]  Fax [phone]

## 2022-01-12 DIAGNOSIS — F3289 Other specified depressive episodes: Secondary | ICD-10-CM | POA: Diagnosis not present

## 2022-01-16 ENCOUNTER — Ambulatory Visit (INDEPENDENT_AMBULATORY_CARE_PROVIDER_SITE_OTHER): Payer: Medicaid Other | Admitting: Plastic Surgery

## 2022-01-16 ENCOUNTER — Other Ambulatory Visit: Payer: Self-pay

## 2022-01-16 ENCOUNTER — Encounter: Payer: Self-pay | Admitting: Plastic Surgery

## 2022-01-16 VITALS — BP 129/85 | HR 83 | Ht 68.0 in | Wt 237.0 lb

## 2022-01-16 DIAGNOSIS — D179 Benign lipomatous neoplasm, unspecified: Secondary | ICD-10-CM

## 2022-01-16 MED ORDER — ONDANSETRON 4 MG PO TBDP: 4.0000 mg | ORAL_TABLET | Freq: Three times a day (TID) | ORAL | 0 refills | Status: DC | PRN

## 2022-01-16 MED ORDER — OXYCODONE HCL 5 MG PO TABS: 5.0000 mg | ORAL_TABLET | ORAL | 0 refills | Status: DC | PRN

## 2022-01-16 NOTE — H&P (View-Only) (Signed)
CC:  Right back fatty mass.   Chris Nelson is an 26 y.o. male.  HPI: The patient is a 26 year old with a history of a right back fatty mass since June 2022.  He notes that it has grown slightly larger recently.  He has had an MRI which shows lesion most compatible with lipoma.  He is preop for surgery.   No Known Allergies       Outpatient Encounter Medications as of 10/31/2021  Medication Sig   atomoxetine (STRATTERA) 10 MG capsule Take 10 mg by mouth daily as needed. Takes only when school is in session.   mupirocin cream (BACTROBAN) 2 % Apply 1 application topically 2 (two) times daily.    No facility-administered encounter medications on file as of 10/31/2021.          Past Medical History:  Diagnosis Date   Premature birth        No past surgical history on file.        Family History  Problem Relation Age of Onset   Hypertension Mother        SH:  no tobacco, no illicit drug use.     Review of Systems General: Denies fevers, chills, weight loss CV: Denies chest pain, shortness of breath, palpitations     Physical Exam Vitals with BMI 10/31/2021 09/12/2021 01/15/2020  Height 5\' 8"  5' 8.78" -  Weight 243 lbs 10 oz 238 lbs 3 oz -  BMI 51.70 01.7 -  Systolic [phone]  Diastolic 91 88 98  Pulse 75 82 104    General:  No acute distress,  Alert and oriented, Non-Toxic, Normal speech and affect Ext: Right side of the back with 17 x 14 cm fatty mass over the region of the latissimus. MRI showed 5.2 x 11.2 x 11.2 fatty mass consistent with lipoma.  There was no T2 enhancement noted.  Assessment/Plan The patient has a right back fatty mass.  He has had an MRI now and is aware this is most likely a lipoma.  He wishes to proceed with surgery

## 2022-01-16 NOTE — Progress Notes (Signed)
CC:  Right back fatty mass.   Chris Nelson is an 26 y.o. male.  HPI: The patient is a 26 year old with a history of a right back fatty mass since June 2022.  He notes that it has grown slightly larger recently.  He has had an MRI which shows lesion most compatible with lipoma.  He is preop for surgery.   No Known Allergies       Outpatient Encounter Medications as of 10/31/2021  Medication Sig   atomoxetine (STRATTERA) 10 MG capsule Take 10 mg by mouth daily as needed. Takes only when school is in session.   mupirocin cream (BACTROBAN) 2 % Apply 1 application topically 2 (two) times daily.    No facility-administered encounter medications on file as of 10/31/2021.          Past Medical History:  Diagnosis Date   Premature birth        No past surgical history on file.        Family History  Problem Relation Age of Onset   Hypertension Mother        SH:  no tobacco, no illicit drug use.     Review of Systems General: Denies fevers, chills, weight loss CV: Denies chest pain, shortness of breath, palpitations     Physical Exam Vitals with BMI 10/31/2021 09/12/2021 01/15/2020  Height 5\' 8"  5' 8.78" -  Weight 243 lbs 10 oz 238 lbs 3 oz -  BMI 73.71 06.2 -  Systolic 694 854 627  Diastolic 91 88 98  Pulse 75 82 104    General:  No acute distress,  Alert and oriented, Non-Toxic, Normal speech and affect Ext: Right side of the back with 17 x 14 cm fatty mass over the region of the latissimus. MRI showed 5.2 x 11.2 x 11.2 fatty mass consistent with lipoma.  There was no T2 enhancement noted.  Assessment/Plan The patient has a right back fatty mass.  He has had an MRI now and is aware this is most likely a lipoma.  He wishes to proceed with surgery

## 2022-01-19 ENCOUNTER — Telehealth: Payer: Self-pay

## 2022-01-19 DIAGNOSIS — F3289 Other specified depressive episodes: Secondary | ICD-10-CM | POA: Diagnosis not present

## 2022-01-19 NOTE — Telephone Encounter (Signed)
DOS 02/16/2022  EXC GANGLION CYST LT - 28090 AMPUTATION TOE IPJ LT - 28825  [employer] EFFECTIVE DATE - 06/24/2021  PLAN DEDUCTIBLE - $0.00 OUT OF POCKET - NO OUT OF POCKET COPAY $0.00 COINSURANCE - 0   GOOD FROM 02/16/2022 TO 05/17/2022  NOTIFICATION/PRIOR AUTHORIZATION NUMBER CASE STATUS CASE STATUS REASON PRIMARY CARE PHYSICIAN [id number] Closed Case Was Managed And Is Now Complete Teresa [facility] NOTIFY DATE/TIME ADMISSION NOTIFY [place] 01/17/2022 03:44 PM CST - COVERAGE STATUS OVERALL COVERAGE STATUS Covered/Approved 1-2 CODE DESCRIPTION COVERAGE STATUS DECISION DATE [facility] Coverage determination is reflected for the facility admission and is not a guarantee of payment for ongoing services. Covered/Approved 01/18/2022 1 28825 Amputation, toe; interphalangeal joint Covered/Approved 01/18/2022 2 28090 Excision of lesion, tendon, tendon sheat more Covered/Approved 01/18/2022

## 2022-01-26 ENCOUNTER — Encounter (HOSPITAL_COMMUNITY): Payer: Self-pay | Admitting: Plastic Surgery

## 2022-01-26 DIAGNOSIS — F3289 Other specified depressive episodes: Secondary | ICD-10-CM | POA: Diagnosis not present

## 2022-01-30 ENCOUNTER — Encounter (HOSPITAL_COMMUNITY): Payer: Self-pay | Admitting: Plastic Surgery

## 2022-01-30 ENCOUNTER — Other Ambulatory Visit: Payer: Self-pay

## 2022-01-30 NOTE — Pre-Procedure Instructions (Signed)
KADAN MILLSTEIN  01/30/2022      [facility] [place], [address] AT [address] [place] [address] Phone: [phone] Fax: [phone]  [id number] - [place], [place] [place] [address] Phone: [phone] Fax: [phone]  [id number] - [place], [place] [address] [facility] [place] [address] Phone: [phone] Fax: [phone]   PCP - Camillia Herter, NP Cardiologist - Denies  Chest x-ray - Denies EKG - Denies Stress Test - Denies ECHO - Denies Cardiac Cath - Denies  ERAS Protcol - NPO  COVID TEST- N/A ambulatory sx  Anesthesia review: N  Patient verbally denies any shortness of breath, fever, cough and chest pain during phone call   -------------  SDW INSTRUCTIONS given:  Your procedure is scheduled on 01/31/22.  Report to [facility] Entrance "A" at 1215 P.M., and check in at the Admitting office.  Call this number if you have problems the morning of surgery:  [phone]   Remember:  Do not eat after midnight the night before your surgery    Take these medicines the morning of surgery with A SIP OF WATER IF Needed: Zofran Oxycodone  As of today, STOP taking any Aspirin (unless otherwise instructed by your surgeon) Aleve, Naproxen, Ibuprofen, Motrin, Advil, Goody's, BC's, all herbal medications, fish oil, and all vitamins.                      Do not wear jewelry, make up, or nail polish            Do not wear lotions, powders, perfumes/colognes, or deodorant.            Do not shave 48 hours prior to surgery.  Men may shave face and neck.            Do not bring valuables to the hospital.            [facility] is not responsible for any belongings or valuables.  Do NOT Smoke (Tobacco/Vaping) 24 hours prior to your procedure If you use a CPAP at night, you may bring all  equipment for your overnight stay.   Contacts, glasses, dentures or bridgework may not be worn into surgery.      For patients admitted to the hospital, discharge time will be determined by your treatment team.   Patients discharged the day of surgery will not be allowed to drive home, and someone needs to stay with them for 24 hours.    Special instructions:   [place]- Preparing For Surgery  Before surgery, you can play an important role. Because skin is not sterile, your skin needs to be as free of germs as possible. You can reduce the number of germs on your skin by washing with CHG (chlorahexidine gluconate) Soap before surgery.  CHG is an antiseptic cleaner which kills germs and bonds with the skin to continue killing germs even after washing.    Oral Hygiene is also important to reduce your risk of infection.  Remember - BRUSH YOUR TEETH THE MORNING OF SURGERY WITH YOUR REGULAR TOOTHPASTE  Please do not use if you have an allergy to CHG or antibacterial soaps. If your skin becomes reddened/irritated stop using the CHG.  Do not shave (including legs and underarms) for at least 48 hours prior to  first CHG shower. It is OK to shave your face.  Please follow these instructions carefully.   Shower the NIGHT BEFORE SURGERY and the MORNING OF SURGERY with DIAL Soap.   Pat yourself dry with a CLEAN TOWEL.  Wear CLEAN PAJAMAS to bed the night before surgery  Place CLEAN SHEETS on your bed the night of your first shower and DO NOT SLEEP WITH PETS.   Day of Surgery: Please shower morning of surgery  Wear Clean/Comfortable clothing the morning of surgery Do not apply any deodorants/lotions.   Remember to brush your teeth WITH YOUR REGULAR TOOTHPASTE.   Questions were answered. Patient verbalized understanding of instructions.

## 2022-01-31 ENCOUNTER — Ambulatory Visit (HOSPITAL_COMMUNITY): Payer: Medicaid Other | Admitting: Anesthesiology

## 2022-01-31 ENCOUNTER — Encounter (HOSPITAL_COMMUNITY): Payer: Self-pay | Admitting: Plastic Surgery

## 2022-01-31 ENCOUNTER — Other Ambulatory Visit: Payer: Self-pay

## 2022-01-31 ENCOUNTER — Ambulatory Visit (HOSPITAL_COMMUNITY)
Admission: RE | Admit: 2022-01-31 | Discharge: 2022-01-31 | Disposition: A | Discharge status: Home / Self Care | Payer: Medicaid Other | Source: Ambulatory Visit | Attending: Plastic Surgery | Admitting: Plastic Surgery

## 2022-01-31 ENCOUNTER — Encounter (HOSPITAL_COMMUNITY)
Admission: RE | Disposition: A | Discharge status: Home / Self Care | Payer: Self-pay | Source: Ambulatory Visit | Attending: Plastic Surgery

## 2022-01-31 DIAGNOSIS — Z6836 Body mass index (BMI) 36.0-36.9, adult: Secondary | ICD-10-CM | POA: Insufficient documentation

## 2022-01-31 DIAGNOSIS — D171 Benign lipomatous neoplasm of skin and subcutaneous tissue of trunk: Secondary | ICD-10-CM | POA: Diagnosis not present

## 2022-01-31 DIAGNOSIS — E669 Obesity, unspecified: Secondary | ICD-10-CM | POA: Insufficient documentation

## 2022-01-31 HISTORY — PX: MASS EXCISION: SHX2000

## 2022-01-31 HISTORY — DX: Attention-deficit hyperactivity disorder, unspecified type: F90.9

## 2022-01-31 LAB — CBC
HCT: 47.5 % (ref 39.0–52.0)
Hemoglobin: 15.7 g/dL (ref 13.0–17.0)
MCH: 28.6 pg (ref 26.0–34.0)
MCHC: 33.1 g/dL (ref 30.0–36.0)
MCV: 86.5 fL (ref 80.0–100.0)
Platelets: 208 10*3/uL (ref 150–400)
RBC: 5.49 MIL/uL (ref 4.22–5.81)
RDW: 12.6 % (ref 11.5–15.5)
WBC: 4.6 10*3/uL (ref 4.0–10.5)
nRBC: 0 % (ref 0.0–0.2)

## 2022-01-31 SURGERY — EXCISION MASS
Anesthesia: General | Site: Back | Laterality: Right

## 2022-01-31 MED ORDER — ONDANSETRON HCL 4 MG/2ML IJ SOLN
INTRAMUSCULAR | Status: DC | PRN
  Administered 2022-01-31: 4 mg via INTRAVENOUS

## 2022-01-31 MED ORDER — BUPIVACAINE-EPINEPHRINE (PF) 0.25% -1:200000 IJ SOLN
INTRAMUSCULAR | Status: DC | PRN
  Administered 2022-01-31: 7.5 mL

## 2022-01-31 MED ORDER — FENTANYL CITRATE (PF) 250 MCG/5ML IJ SOLN
INTRAMUSCULAR | Status: AC
  Filled 2022-01-31: qty 5

## 2022-01-31 MED ORDER — LIDOCAINE 2% (20 MG/ML) 5 ML SYRINGE
INTRAMUSCULAR | Status: AC
  Filled 2022-01-31: qty 5

## 2022-01-31 MED ORDER — LACTATED RINGERS IV SOLN: INTRAVENOUS | Status: DC

## 2022-01-31 MED ORDER — FENTANYL CITRATE (PF) 250 MCG/5ML IJ SOLN
INTRAMUSCULAR | Status: DC | PRN
  Administered 2022-01-31: 100 ug via INTRAVENOUS
  Administered 2022-01-31: 50 ug via INTRAVENOUS

## 2022-01-31 MED ORDER — BUPIVACAINE-EPINEPHRINE 0.5% -1:200000 IJ SOLN
INTRAMUSCULAR | Status: AC
  Filled 2022-01-31: qty 1

## 2022-01-31 MED ORDER — PROPOFOL 10 MG/ML IV BOLUS
INTRAVENOUS | Status: AC
  Filled 2022-01-31: qty 20

## 2022-01-31 MED ORDER — LIDOCAINE-EPINEPHRINE (PF) 1 %-1:200000 IJ SOLN
INTRAMUSCULAR | Status: DC | PRN
  Administered 2022-01-31: 7.5 mL

## 2022-01-31 MED ORDER — SODIUM CHLORIDE 0.9% FLUSH: 3.0000 mL | INTRAVENOUS | Status: DC | PRN

## 2022-01-31 MED ORDER — SODIUM CHLORIDE 0.9% FLUSH: 3.0000 mL | Freq: Two times a day (BID) | INTRAVENOUS | Status: DC

## 2022-01-31 MED ORDER — LIDOCAINE-EPINEPHRINE (PF) 1 %-1:200000 IJ SOLN
INTRAMUSCULAR | Status: AC
  Filled 2022-01-31: qty 30

## 2022-01-31 MED ORDER — ROCURONIUM BROMIDE 10 MG/ML (PF) SYRINGE
PREFILLED_SYRINGE | INTRAVENOUS | Status: DC | PRN
Start: 2022-01-31 — End: 2022-01-31
  Administered 2022-01-31: 70 mg via INTRAVENOUS

## 2022-01-31 MED ORDER — OXYCODONE HCL 5 MG PO TABS: 5.0000 mg | ORAL_TABLET | ORAL | Status: DC | PRN

## 2022-01-31 MED ORDER — CEFAZOLIN SODIUM-DEXTROSE 2-4 GM/100ML-% IV SOLN
2.0000 g | INTRAVENOUS | Status: AC
  Administered 2022-01-31: 2 g via INTRAVENOUS
  Filled 2022-01-31: qty 100

## 2022-01-31 MED ORDER — ROCURONIUM BROMIDE 10 MG/ML (PF) SYRINGE
PREFILLED_SYRINGE | INTRAVENOUS | Status: AC
  Filled 2022-01-31: qty 10

## 2022-01-31 MED ORDER — ACETAMINOPHEN 650 MG RE SUPP: 650.0000 mg | RECTAL | Status: DC | PRN

## 2022-01-31 MED ORDER — PROMETHAZINE HCL 25 MG/ML IJ SOLN: 6.2500 mg | INTRAMUSCULAR | Status: DC | PRN

## 2022-01-31 MED ORDER — MIDAZOLAM HCL 2 MG/2ML IJ SOLN
INTRAMUSCULAR | Status: AC
  Filled 2022-01-31: qty 2

## 2022-01-31 MED ORDER — DEXAMETHASONE SODIUM PHOSPHATE 10 MG/ML IJ SOLN
INTRAMUSCULAR | Status: DC | PRN
Start: 2022-01-31 — End: 2022-01-31
  Administered 2022-01-31: 10 mg via INTRAVENOUS

## 2022-01-31 MED ORDER — CHLORHEXIDINE GLUCONATE CLOTH 2 % EX PADS: 6.0000 | MEDICATED_PAD | Freq: Once | CUTANEOUS | Status: DC

## 2022-01-31 MED ORDER — ONDANSETRON HCL 4 MG/2ML IJ SOLN
INTRAMUSCULAR | Status: AC
  Filled 2022-01-31: qty 2

## 2022-01-31 MED ORDER — FENTANYL CITRATE (PF) 100 MCG/2ML IJ SOLN: 25.0000 ug | INTRAMUSCULAR | Status: DC | PRN

## 2022-01-31 MED ORDER — SODIUM CHLORIDE 0.9 % IV SOLN: 250.0000 mL | INTRAVENOUS | Status: DC | PRN

## 2022-01-31 MED ORDER — SUGAMMADEX SODIUM 200 MG/2ML IV SOLN
INTRAVENOUS | Status: DC | PRN
Start: 2022-01-31 — End: 2022-01-31
  Administered 2022-01-31: 200 mg via INTRAVENOUS
  Administered 2022-01-31: 100 mg via INTRAVENOUS

## 2022-01-31 MED ORDER — 0.9 % SODIUM CHLORIDE (POUR BTL) OPTIME
TOPICAL | Status: DC | PRN
Start: 2022-01-31 — End: 2022-01-31
  Administered 2022-01-31: 1000 mL

## 2022-01-31 MED ORDER — CHLORHEXIDINE GLUCONATE 0.12 % MT SOLN
15.0000 mL | Freq: Once | OROMUCOSAL | Status: AC
  Administered 2022-01-31: 15 mL via OROMUCOSAL
  Filled 2022-01-31: qty 15

## 2022-01-31 MED ORDER — ORAL CARE MOUTH RINSE: 15.0000 mL | Freq: Once | OROMUCOSAL | Status: AC

## 2022-01-31 MED ORDER — LIDOCAINE 2% (20 MG/ML) 5 ML SYRINGE
INTRAMUSCULAR | Status: DC | PRN
Start: 2022-01-31 — End: 2022-01-31
  Administered 2022-01-31: 60 mg via INTRAVENOUS

## 2022-01-31 MED ORDER — MIDAZOLAM HCL 2 MG/2ML IJ SOLN
INTRAMUSCULAR | Status: DC | PRN
Start: 2022-01-31 — End: 2022-01-31
  Administered 2022-01-31: 2 mg via INTRAVENOUS

## 2022-01-31 MED ORDER — ACETAMINOPHEN 325 MG PO TABS: 650.0000 mg | ORAL_TABLET | ORAL | Status: DC | PRN

## 2022-01-31 MED ORDER — DEXAMETHASONE SODIUM PHOSPHATE 10 MG/ML IJ SOLN
INTRAMUSCULAR | Status: AC
  Filled 2022-01-31: qty 1

## 2022-01-31 MED ORDER — PROPOFOL 10 MG/ML IV BOLUS
INTRAVENOUS | Status: DC | PRN
Start: 2022-01-31 — End: 2022-01-31
  Administered 2022-01-31: 20 mg via INTRAVENOUS
  Administered 2022-01-31: 200 mg via INTRAVENOUS

## 2022-01-31 MED ORDER — ACETAMINOPHEN 500 MG PO TABS
1000.0000 mg | ORAL_TABLET | Freq: Once | ORAL | Status: AC
  Administered 2022-01-31: 1000 mg via ORAL
  Filled 2022-01-31: qty 2

## 2022-01-31 SURGICAL SUPPLY — 47 items
BAG COUNTER SPONGE SURGICOUNT (BAG) ×1 IMPLANT
BENZOIN TINCTURE PRP APPL 2/3 (GAUZE/BANDAGES/DRESSINGS) ×1 IMPLANT
BLADE CLIPPER SURG (BLADE) IMPLANT
BLADE SURG 15 STRL LF DISP TIS (BLADE) ×1 IMPLANT
BLADE SURG 15 STRL SS (BLADE) ×2
CANISTER SUCT 3000ML PPV (MISCELLANEOUS) IMPLANT
COVER SURGICAL LIGHT HANDLE (MISCELLANEOUS) ×1 IMPLANT
DECANTER SPIKE VIAL GLASS SM (MISCELLANEOUS) ×2 IMPLANT
DERMABOND ADVANCED (GAUZE/BANDAGES/DRESSINGS)
DERMABOND ADVANCED .7 DNX12 (GAUZE/BANDAGES/DRESSINGS) IMPLANT
DRAIN CHANNEL 15F RND FF W/TCR (WOUND CARE) ×1 IMPLANT
DRAPE LAPAROTOMY 100X72 PEDS (DRAPES) IMPLANT
DRAPE U-SHAPE 76X120 STRL (DRAPES) IMPLANT
DRSG AQUACEL AG ADV 3.5X10 (GAUZE/BANDAGES/DRESSINGS) ×1 IMPLANT
DRSG TEGADERM 2-3/8X2-3/4 SM (GAUZE/BANDAGES/DRESSINGS) IMPLANT
ELECT BLADE 4.0 EZ CLEAN MEGAD (MISCELLANEOUS) ×2
ELECT CAUTERY BLADE 6.4 (BLADE) IMPLANT
ELECT COATED BLADE 2.86 ST (ELECTRODE) IMPLANT
ELECT NDL BLADE 2-5/6 (NEEDLE) IMPLANT
ELECT NEEDLE BLADE 2-5/6 (NEEDLE) IMPLANT
ELECT REM PT RETURN 9FT ADLT (ELECTROSURGICAL) ×2
ELECTRODE BLDE 4.0 EZ CLN MEGD (MISCELLANEOUS) IMPLANT
ELECTRODE REM PT RTRN 9FT ADLT (ELECTROSURGICAL) ×1 IMPLANT
EVACUATOR SILICONE 100CC (DRAIN) ×1 IMPLANT
GAUZE SPONGE 4X4 12PLY STRL LF (GAUZE/BANDAGES/DRESSINGS) ×2 IMPLANT
GLOVE SURG ENC MOIS LTX SZ6.5 (GLOVE) ×4 IMPLANT
GLOVE SURG UNDER POLY LF SZ7 (GLOVE) ×1 IMPLANT
GOWN STRL REUS W/ TWL LRG LVL3 (GOWN DISPOSABLE) ×3 IMPLANT
GOWN STRL REUS W/TWL LRG LVL3 (GOWN DISPOSABLE) ×6
KIT BASIN OR (CUSTOM PROCEDURE TRAY) ×2 IMPLANT
NDL HYPO 25GX1X1/2 BEV (NEEDLE) ×1 IMPLANT
NEEDLE HYPO 25GX1X1/2 BEV (NEEDLE) ×2 IMPLANT
NS IRRIG 1000ML POUR BTL (IV SOLUTION) ×2 IMPLANT
PACK SURGICAL SETUP 50X90 (CUSTOM PROCEDURE TRAY) ×2 IMPLANT
PENCIL BUTTON HOLSTER BLD 10FT (ELECTRODE) ×2 IMPLANT
RETRACTOR ONETRAX LX 135X30 (MISCELLANEOUS) ×1 IMPLANT
RETRACTOR ONETRAX LX 90X20 (MISCELLANEOUS) IMPLANT
STAPLER VISISTAT 35W (STAPLE) ×1 IMPLANT
STRIP CLOSURE SKIN 1/2X4 (GAUZE/BANDAGES/DRESSINGS) ×1 IMPLANT
SUT ETHILON 3 0 FSL (SUTURE) ×1 IMPLANT
SUT PDS AB 3-0 SH 27 (SUTURE) ×2 IMPLANT
SUT VLOC 90 P-14 23 (SUTURE) ×1 IMPLANT
SYR BULB EAR ULCER 3OZ GRN STR (SYRINGE) ×2 IMPLANT
SYR CONTROL 10ML LL (SYRINGE) ×2 IMPLANT
TOWEL GREEN STERILE FF (TOWEL DISPOSABLE) ×2 IMPLANT
TUBE CONNECTING 12X1/4 (SUCTIONS) ×2 IMPLANT
YANKAUER SUCT BULB TIP NO VENT (SUCTIONS) ×2 IMPLANT

## 2022-01-31 NOTE — Anesthesia Postprocedure Evaluation (Signed)
Anesthesia Post Note  Patient: KELSEY EDMAN  Procedure(s) Performed: EXCISION RIGHT BACK MASS (Right: Back)     Patient location during evaluation: PACU Anesthesia Type: General Level of consciousness: awake and alert Pain management: pain level controlled Vital Signs Assessment: post-procedure vital signs reviewed and stable Respiratory status: spontaneous breathing, nonlabored ventilation, respiratory function stable and patient connected to nasal cannula oxygen Cardiovascular status: blood pressure returned to baseline and stable Postop Assessment: no apparent nausea or vomiting Anesthetic complications: no   No notable events documented.  Last Vitals:  Vitals:   01/31/22 1526 01/31/22 1541  BP: 131/69 131/75  Pulse: 87 99  Resp: (!) 25 14  Temp:    SpO2: 96% 99%    Last Pain:  Vitals:   01/31/22 1526  TempSrc:   PainSc: Asleep                 Lynnita Somma

## 2022-01-31 NOTE — Anesthesia Preprocedure Evaluation (Addendum)
Anesthesia Evaluation  Patient identified by MRN, date of birth, ID band Patient awake    Reviewed: Allergy & Precautions, NPO status , Patient's Chart, lab work & pertinent test results  Airway Mallampati: II  TM Distance: >3 FB Neck ROM: Full    Dental  (+) Teeth Intact, Dental Advisory Given   Pulmonary neg pulmonary ROS,    Pulmonary exam normal breath sounds clear to auscultation       Cardiovascular Exercise Tolerance: Good negative cardio ROS Normal cardiovascular exam Rhythm:Regular Rate:Normal     Neuro/Psych negative neurological ROS     GI/Hepatic negative GI ROS, Neg liver ROS,   Endo/Other  Obesity   Renal/GU negative Renal ROS     Musculoskeletal negative musculoskeletal ROS (+) Subfascial Lipoma   Abdominal   Peds  (+) ADHD Hematology negative hematology ROS (+)   Anesthesia Other Findings Day of surgery medications reviewed with the patient.  Reproductive/Obstetrics                            Anesthesia Physical Anesthesia Plan  ASA: 2  Anesthesia Plan: General   Post-op Pain Management: Tylenol PO (pre-op)   Induction: Intravenous  PONV Risk Score and Plan: 2 and Midazolam, Dexamethasone and Ondansetron  Airway Management Planned: Oral ETT  Additional Equipment:   Intra-op Plan:   Post-operative Plan: Extubation in OR  Informed Consent: I have reviewed the patients History and Physical, chart, labs and discussed the procedure including the risks, benefits and alternatives for the proposed anesthesia with the patient or authorized representative who has indicated his/her understanding and acceptance.     Dental advisory given  Plan Discussed with: CRNA  Anesthesia Plan Comments:        Anesthesia Quick Evaluation

## 2022-01-31 NOTE — Op Note (Signed)
Operative Note   DATE OF OPERATION: 01/31/2022  SURGICAL DEPARTMENT: Plastic Surgery  PREOPERATIVE DIAGNOSES:  lipoma right back  POSTOPERATIVE DIAGNOSES:  same  PROCEDURE:   1) excision of 12 cm subfascial deep tissue mass right back with 12 cm intermediate closure  SURGEON: Skarlette Lattner P. Daishon Chui, MD  ASSISTANT: none.  ANESTHESIA:  General.   COMPLICATIONS: None.   INDICATIONS FOR PROCEDURE:  The patient, Chris Nelson is a 26 y.o. male born on Jul 28, 1996, is here for treatment of subfascial lipoma. MRN: [id number]  CONSENT:  Informed consent was obtained directly from the patient. Risks, benefits and alternatives were fully discussed. Specific risks including but not limited to bleeding, infection, hematoma, seroma, scarring, pain, contracture, asymmetry, wound healing problems, and need for further surgery were all discussed. The patient did have an ample opportunity to have questions answered to satisfaction.   DESCRIPTION OF PROCEDURE:  The patient was taken to the operating room. SCDs were placed and preop antibiotics were given. General anesthesia was administered.  The patient was placed in a left side down lateral position.  The patient's operative site was prepped and draped in a sterile fashion. A time out was performed and all information was confirmed to be correct.    The surgical site was injected with 30 ml of 0.25 % marcaine with epi and mixed 1: 1 with 1% lidocaine with epinephrine.  A skin incision was made with a 15 blade.  Bovie electrocautery was used to dissect down through fascia to the fatty mass.  Primarily blunt dissection was used to isolate the mass.  Any attachments were cut with bovie electrocautery.  The mass was removed intact and sent to pathology for permanent.  A small additional piece of fat that was adjacent was also sent with the specimen.  Copious irrigation was used and then a lighted retractor was used for hemostasis with bovie.  A 15 [place] Blake drain  was used.  The wound was then closed in a layered fashioned with 3-0 PDS for deeper tissue and dermis and a running V-lock 90 for skin subcuticular.  A dry sterile dressing was then applied.  The patient tolerated the procedure well.  There were no complications. The patient was allowed to wake from anesthesia, extubated and taken to the recovery room in satisfactory condition.

## 2022-01-31 NOTE — Anesthesia Postprocedure Evaluation (Signed)
Anesthesia Post Note  Patient: Chris Nelson  Procedure(s) Performed: EXCISION RIGHT BACK MASS (Right: Back)     Patient location during evaluation: PACU Anesthesia Type: General Level of consciousness: awake and alert Pain management: pain level controlled Vital Signs Assessment: post-procedure vital signs reviewed and stable Respiratory status: spontaneous breathing, nonlabored ventilation, respiratory function stable and patient connected to nasal cannula oxygen Cardiovascular status: blood pressure returned to baseline and stable Postop Assessment: no apparent nausea or vomiting Anesthetic complications: no   No notable events documented.  Last Vitals:  Vitals:   01/31/22 1526 01/31/22 1541  BP: 131/69 131/75  Pulse: 87 99  Resp: (!) 25 14  Temp:    SpO2: 96% 99%    Last Pain:  Vitals:   01/31/22 1526  TempSrc:   PainSc: Asleep                 Albaraa Swingle

## 2022-01-31 NOTE — Interval H&P Note (Signed)
History and Physical Interval Note:  01/31/2022 12:03 PM  Chris Nelson  has presented today for surgery, with the diagnosis of Subfascial Lipoma.  The various methods of treatment have been discussed with the patient and family. After consideration of risks, benefits and other options for treatment, the patient has consented to  Procedure(s) with comments: EXCISION RIGHT BACK MASS (Right) - 45 minutes as a surgical intervention.  The patient's history has been reviewed, patient examined, no change in status, stable for surgery.  I have reviewed the patient's chart and labs.  Questions were answered to the patient's satisfaction.     [name]

## 2022-01-31 NOTE — Anesthesia Procedure Notes (Signed)
Procedure Name: Intubation Date/Time: 01/31/2022 1:06 PM Performed by: Lorie Phenix, CRNA Pre-anesthesia Checklist: Patient identified, Emergency Drugs available, Suction available and Patient being monitored Patient Re-evaluated:Patient Re-evaluated prior to induction Oxygen Delivery Method: Circle system utilized Preoxygenation: Pre-oxygenation with 100% oxygen Induction Type: IV induction Ventilation: Mask ventilation without difficulty Laryngoscope Size: Mac and 4 Grade View: Grade I Tube type: Oral Tube size: 7.5 mm Number of attempts: 1 Airway Equipment and Method: Stylet Placement Confirmation: ETT inserted through vocal cords under direct vision, positive ETCO2 and breath sounds checked- equal and bilateral Secured at: 24 cm Tube secured with: Tape Dental Injury: Teeth and Oropharynx as per pre-operative assessment

## 2022-01-31 NOTE — Transfer of Care (Signed)
Immediate Anesthesia Transfer of Care Note  Patient: Chris Nelson  Procedure(s) Performed: EXCISION RIGHT BACK MASS (Right: Back)  Patient Location: PACU  Anesthesia Type:General  Level of Consciousness: drowsy and patient cooperative  Airway & Oxygen Therapy: Patient Spontanous Breathing and Patient connected to face mask oxygen  Post-op Assessment: Report given to RN and Post -op Vital signs reviewed and stable  Post vital signs: Reviewed and stable  Last Vitals:  Vitals Value Taken Time  BP 125/63 01/31/22 1457  Temp 36.8 C 01/31/22 1457  Pulse 81 01/31/22 1503  Resp 26 01/31/22 1503  SpO2 100 % 01/31/22 1503  Vitals shown include unvalidated device data.  Last Pain:  Vitals:   01/31/22 1016  TempSrc:   PainSc: 0-No pain         Complications: No notable events documented.

## 2022-02-01 ENCOUNTER — Encounter (HOSPITAL_COMMUNITY): Payer: Self-pay | Admitting: Plastic Surgery

## 2022-02-01 LAB — SURGICAL PATHOLOGY

## 2022-02-02 DIAGNOSIS — F3289 Other specified depressive episodes: Secondary | ICD-10-CM | POA: Diagnosis not present

## 2022-02-06 ENCOUNTER — Ambulatory Visit (INDEPENDENT_AMBULATORY_CARE_PROVIDER_SITE_OTHER): Payer: Medicaid Other | Admitting: Plastic Surgery

## 2022-02-06 ENCOUNTER — Other Ambulatory Visit: Payer: Self-pay

## 2022-02-06 DIAGNOSIS — D179 Benign lipomatous neoplasm, unspecified: Secondary | ICD-10-CM

## 2022-02-06 NOTE — Progress Notes (Signed)
Patient is status post excision of right back mass 1 week ago.  He doing well without complaints  Physical exam Incision clean dry and intact no hematoma or seroma on exam, drain is out this morning had light serosanguineous drainage  Pathology: Lipoma, no evidence of malignancy.  Assessment and plan Doing well we will see him back in 2 to 3 weeks for wound recheck and make sure he does not develop any seroma.

## 2022-02-09 DIAGNOSIS — F3289 Other specified depressive episodes: Secondary | ICD-10-CM | POA: Diagnosis not present

## 2022-02-16 ENCOUNTER — Other Ambulatory Visit: Payer: Self-pay | Admitting: Podiatry

## 2022-02-16 ENCOUNTER — Encounter: Payer: Self-pay | Admitting: Podiatry

## 2022-02-16 DIAGNOSIS — L97529 Non-pressure chronic ulcer of other part of left foot with unspecified severity: Secondary | ICD-10-CM

## 2022-02-16 DIAGNOSIS — L600 Ingrowing nail: Secondary | ICD-10-CM | POA: Diagnosis not present

## 2022-02-16 DIAGNOSIS — S91109A Unspecified open wound of unspecified toe(s) without damage to nail, initial encounter: Secondary | ICD-10-CM | POA: Diagnosis not present

## 2022-02-16 MED ORDER — ONDANSETRON 4 MG PO TBDP: 4.0000 mg | ORAL_TABLET | Freq: Three times a day (TID) | ORAL | 0 refills | Status: DC | PRN

## 2022-02-16 MED ORDER — OXYCODONE-ACETAMINOPHEN 5-325 MG PO TABS: 1.0000 | ORAL_TABLET | ORAL | 0 refills | Status: DC | PRN

## 2022-02-17 ENCOUNTER — Telehealth: Payer: Self-pay | Admitting: *Deleted

## 2022-02-17 NOTE — Telephone Encounter (Signed)
Pain medicine request per patient. Refill was sent in on 02/16/22.please advise.

## 2022-02-20 ENCOUNTER — Ambulatory Visit (INDEPENDENT_AMBULATORY_CARE_PROVIDER_SITE_OTHER): Payer: Medicaid Other | Admitting: Plastic Surgery

## 2022-02-20 ENCOUNTER — Other Ambulatory Visit: Payer: Self-pay | Admitting: Podiatry

## 2022-02-20 ENCOUNTER — Other Ambulatory Visit: Payer: Self-pay

## 2022-02-20 ENCOUNTER — Encounter: Payer: Self-pay | Admitting: Plastic Surgery

## 2022-02-20 DIAGNOSIS — D179 Benign lipomatous neoplasm, unspecified: Secondary | ICD-10-CM

## 2022-02-20 MED ORDER — OXYCODONE-ACETAMINOPHEN 5-325 MG PO TABS: 1.0000 | ORAL_TABLET | ORAL | 0 refills | Status: DC | PRN

## 2022-02-20 NOTE — Telephone Encounter (Signed)
Refill sent.  Thanks, Dr. Amalia Hailey

## 2022-02-20 NOTE — Progress Notes (Signed)
Patient is status post excision of right back mass.  Doing well without complaints  Physical exam Incision clean dry intact, no hematoma or seroma.  Pathology: Lipoma  Assessment and plan Doing well-recommend scar massage and sunscreen.  We will see the patient back as needed.

## 2022-02-22 ENCOUNTER — Other Ambulatory Visit: Payer: Self-pay

## 2022-02-22 ENCOUNTER — Ambulatory Visit (INDEPENDENT_AMBULATORY_CARE_PROVIDER_SITE_OTHER): Payer: Medicaid Other | Admitting: Podiatry

## 2022-02-22 ENCOUNTER — Ambulatory Visit (INDEPENDENT_AMBULATORY_CARE_PROVIDER_SITE_OTHER): Payer: Medicaid Other

## 2022-02-22 DIAGNOSIS — Z9889 Other specified postprocedural states: Secondary | ICD-10-CM

## 2022-02-22 NOTE — Progress Notes (Signed)
? ?  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 DOS: 02/16/2022.  Patient states that he is doing well.  He has kept the dressings clean dry and intact since surgery.  He is weightbearing in surgical shoe as instructed.  No new complaints at this time ? ?Past Medical History:  ?Diagnosis Date  ? ADHD (attention deficit hyperactivity disorder)   ? Premature birth   ? ?  ? ?Objective/Physical Exam ?Neurovascular status intact.  Skin incisions appear to be well coapted with sutures intact. No sign of infectious process noted. No dehiscence. No active bleeding noted. Moderate edema noted to the surgical extremity. ? ?Radiographic Exam:  ?Osteotomy site of the distal phalanx of the left hallux appears stable with good routine healing.  Clean osteotomy. ? ?Assessment: ?1. 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 DOS: 02/16/2022 ? ? ?Plan of Care:  ?1. Patient was evaluated. X-rays reviewed ?2.  Dressings changed.  Clean dry and intact x1 additional week ?3.  Continue weightbearing in the postsurgical shoe ?4.  Return to clinic in 1 week ? ? ?Edrick Kins, DPM ?[place] ? ?Dr. Edrick Kins, DPM  ?  ?[address]                                    ?[place],  [address]                ?Office [phone]  ?Fax [phone] ? ? ? ? ? ?

## 2022-02-23 DIAGNOSIS — F3289 Other specified depressive episodes: Secondary | ICD-10-CM | POA: Diagnosis not present

## 2022-02-27 ENCOUNTER — Ambulatory Visit (INDEPENDENT_AMBULATORY_CARE_PROVIDER_SITE_OTHER): Payer: Medicaid Other | Admitting: Podiatry

## 2022-02-27 ENCOUNTER — Other Ambulatory Visit: Payer: Self-pay

## 2022-02-27 DIAGNOSIS — Z9889 Other specified postprocedural states: Secondary | ICD-10-CM

## 2022-02-27 NOTE — Progress Notes (Signed)
? ?  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 DOS: 02/16/2022.  Patient states that he is doing very well.  He has no pain at all associated to the surgical toe.  He has been weightbearing in the postsurgical shoe and the dressings have been clean and dry for the past week.  No new complaints at this time ? ?Past Medical History:  ?Diagnosis Date  ? ADHD (attention deficit hyperactivity disorder)   ? Premature birth   ? ?  ?Past Surgical History:  ?Procedure Laterality Date  ? MASS EXCISION Right 01/31/2022  ? Procedure: EXCISION RIGHT BACK MASS;  Surgeon: [name], MD;  Location: [facility];  Service: Plastics;  Laterality: Right;  45 minutes  ? ?No Known Allergies ? ? ?Objective/Physical Exam ?Neurovascular status intact.  Skin incisions appear to be well coapted with sutures intact. No sign of infectious process noted. No dehiscence. No active bleeding noted.  No edema noted to the surgical extremity. ? ?Assessment: ?1. 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 DOS: 02/16/2022 ? ? ?Plan of Care:  ?1. Patient was evaluated.  ?2.  The skin incisions are well coapted and there is no drainage to the incision site.  I am going to let the patient wash and shower and get the foot wet.  Recommend antibiotic soap. ?3.  Silvadene cream provided to apply daily ?4.  Recommend clean cotton sock daily ?5.  Continue postsurgical shoe ?6.  Return to clinic in 1 week for suture removal and follow-up x-ray ? ?Edrick Kins, DPM ?[place] ? ?Dr. Edrick Kins, DPM  ?  ?[address]                                    ?[place], [place] [address]                ?Office [phone]  ?Fax [phone] ? ? ? ? ? ?

## 2022-03-01 ENCOUNTER — Encounter: Payer: Medicaid Other | Admitting: Podiatry

## 2022-03-02 DIAGNOSIS — F3289 Other specified depressive episodes: Secondary | ICD-10-CM | POA: Diagnosis not present

## 2022-03-06 ENCOUNTER — Ambulatory Visit (INDEPENDENT_AMBULATORY_CARE_PROVIDER_SITE_OTHER): Payer: Medicaid Other

## 2022-03-06 ENCOUNTER — Other Ambulatory Visit: Payer: Self-pay

## 2022-03-06 ENCOUNTER — Ambulatory Visit (INDEPENDENT_AMBULATORY_CARE_PROVIDER_SITE_OTHER): Payer: Medicaid Other | Admitting: Podiatry

## 2022-03-06 ENCOUNTER — Encounter: Payer: Self-pay | Admitting: Podiatry

## 2022-03-06 DIAGNOSIS — Z9889 Other specified postprocedural states: Secondary | ICD-10-CM

## 2022-03-06 NOTE — Progress Notes (Signed)
? ?  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 DOS: 02/16/2022.  Patient doing well.  He has been weightbearing in the postsurgical shoe as directed.  No new complaints at this time. ?Past Medical History:  ?Diagnosis Date  ? ADHD (attention deficit hyperactivity disorder)   ? Premature birth   ? ?  ?Past Surgical History:  ?Procedure Laterality Date  ? MASS EXCISION Right 01/31/2022  ? Procedure: EXCISION RIGHT BACK MASS;  Surgeon: [name], MD;  Location: [facility];  Service: Plastics;  Laterality: Right;  45 minutes  ? ?No Known Allergies ? ? ?Objective/Physical Exam ?Neurovascular status intact.  Skin incisions appear to be well coapted with sutures intact. No sign of infectious process noted. No dehiscence. No active bleeding noted.  No edema noted to the surgical extremity. ? ?Radiographic exam LT foot ?Osteotomy site noted to the distal phalanx of the toe which demonstrates good routine healing. ? ?Assessment: ?1. 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 DOS: 02/16/2022 ? ? ?Plan of Care:  ?1. Patient was evaluated.  X-rays reviewed today ?2.  Sutures removed ?3.  Patient may discontinue the postsurgical shoe and resume normal tennis shoes and sneakers ?4.  Planning to return to work 03/13/2022 full activity no restrictions ?5.  Return to clinic in 3 weeks for final follow-up and x-ray ? ?Edrick Kins, DPM ?[place] ? ?Dr. Edrick Kins, DPM  ?  ?[address]                                    ?[place], [place] [address]                ?Office [phone]  ?Fax [phone] ? ? ? ? ? ?

## 2022-03-09 DIAGNOSIS — F3289 Other specified depressive episodes: Secondary | ICD-10-CM | POA: Diagnosis not present

## 2022-03-15 ENCOUNTER — Encounter: Payer: Medicaid Other | Admitting: Podiatry

## 2022-03-16 DIAGNOSIS — F3289 Other specified depressive episodes: Secondary | ICD-10-CM | POA: Diagnosis not present

## 2022-03-23 DIAGNOSIS — F3289 Other specified depressive episodes: Secondary | ICD-10-CM | POA: Diagnosis not present

## 2022-03-27 ENCOUNTER — Ambulatory Visit (INDEPENDENT_AMBULATORY_CARE_PROVIDER_SITE_OTHER): Payer: Medicaid Other | Admitting: Podiatry

## 2022-03-27 ENCOUNTER — Ambulatory Visit (INDEPENDENT_AMBULATORY_CARE_PROVIDER_SITE_OTHER): Payer: Medicaid Other

## 2022-03-27 DIAGNOSIS — Z9889 Other specified postprocedural states: Secondary | ICD-10-CM | POA: Diagnosis not present

## 2022-03-27 NOTE — Progress Notes (Signed)
? ?  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 DOS: 02/16/2022.  Patient has been doing very well.  He is wearing regular tennis shoes and has no pain. ? ? ?Past Medical History:  ?Diagnosis Date  ? ADHD (attention deficit hyperactivity disorder)   ? Premature birth   ? ?  ?Past Surgical History:  ?Procedure Laterality Date  ? MASS EXCISION Right 01/31/2022  ? Procedure: EXCISION RIGHT BACK MASS;  Surgeon: [name], MD;  Location: [facility];  Service: Plastics;  Laterality: Right;  45 minutes  ? ?No Known Allergies ? ? ?Objective/Physical Exam ?Neurovascular status intact.  Skin incisions healed.  No edema.  Nicely healed surgical toe.  No tenderness or pain with palpation or range of motion or walking ? ?Assessment: ?1. 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 DOS: 02/16/2022 ? ? ?Plan of Care:  ?1. Patient was evaluated.  ?2.  Overall the patient is doing very well.  No pain.  The incision is completely healed. ?3.  Recommend good supportive shoes and sneakers.  Full activity no restrictions ?4.  Return to clinic as needed ? ?Edrick Kins, DPM ?[place] ? ?Dr. Edrick Kins, DPM  ?  ?[address]                                    ?[place], [place] [address]                ?Office [phone]  ?Fax [phone] ? ? ? ? ? ?

## 2022-04-03 DIAGNOSIS — H5213 Myopia, bilateral: Secondary | ICD-10-CM | POA: Diagnosis not present

## 2022-05-01 DIAGNOSIS — F3289 Other specified depressive episodes: Secondary | ICD-10-CM | POA: Diagnosis not present

## 2022-05-11 DIAGNOSIS — F3289 Other specified depressive episodes: Secondary | ICD-10-CM | POA: Diagnosis not present

## 2022-05-17 DIAGNOSIS — F3289 Other specified depressive episodes: Secondary | ICD-10-CM | POA: Diagnosis not present

## 2022-06-22 DIAGNOSIS — F3289 Other specified depressive episodes: Secondary | ICD-10-CM | POA: Diagnosis not present

## 2022-06-29 DIAGNOSIS — F3289 Other specified depressive episodes: Secondary | ICD-10-CM | POA: Diagnosis not present

## 2022-07-06 DIAGNOSIS — F3289 Other specified depressive episodes: Secondary | ICD-10-CM | POA: Diagnosis not present

## 2022-07-13 DIAGNOSIS — F3289 Other specified depressive episodes: Secondary | ICD-10-CM | POA: Diagnosis not present

## 2022-07-27 DIAGNOSIS — F3289 Other specified depressive episodes: Secondary | ICD-10-CM | POA: Diagnosis not present

## 2022-08-10 DIAGNOSIS — F3289 Other specified depressive episodes: Secondary | ICD-10-CM | POA: Diagnosis not present

## 2022-08-16 DIAGNOSIS — F3289 Other specified depressive episodes: Secondary | ICD-10-CM | POA: Diagnosis not present

## 2022-08-17 DIAGNOSIS — F3289 Other specified depressive episodes: Secondary | ICD-10-CM | POA: Diagnosis not present

## 2022-08-31 DIAGNOSIS — F3289 Other specified depressive episodes: Secondary | ICD-10-CM | POA: Diagnosis not present

## 2022-09-07 DIAGNOSIS — F3289 Other specified depressive episodes: Secondary | ICD-10-CM | POA: Diagnosis not present

## 2022-09-14 DIAGNOSIS — F3289 Other specified depressive episodes: Secondary | ICD-10-CM | POA: Diagnosis not present

## 2022-09-18 ENCOUNTER — Encounter: Payer: Medicaid Other | Admitting: Family Medicine

## 2022-09-21 DIAGNOSIS — F3289 Other specified depressive episodes: Secondary | ICD-10-CM | POA: Diagnosis not present

## 2022-10-12 DIAGNOSIS — F3289 Other specified depressive episodes: Secondary | ICD-10-CM | POA: Diagnosis not present

## 2022-10-23 NOTE — Progress Notes (Signed)
Patient ID: Chris Nelson, male    DOB: 24-Oct-1996  MRN: [id number]  CC: Annual Physical Exam   Subjective: Chris Nelson is a 26 y.o. male who presents for annual physical exam.   His concerns today include:  None.   Patient Active Problem List   Diagnosis Date Noted   Prediabetes 09/13/2021     No current outpatient medications on file prior to visit.   No current facility-administered medications on file prior to visit.    No Known Allergies  Social History   Socioeconomic History   Marital status: Single    Spouse name: Not on file   Number of children: Not on file   Years of education: Not on file   Highest education level: Not on file  Occupational History   Not on file  Tobacco Use   Smoking status: Never    Passive exposure: Never   Smokeless tobacco: Never  Vaping Use   Vaping Use: Never used  Substance and Sexual Activity   Alcohol use: Not Currently   Drug use: Never   Sexual activity: Not on file  Other Topics Concern   Not on file  Social History Narrative   Not on file   Social Determinants of Health   Financial Resource Strain: Not on file  Food Insecurity: Not on file  Transportation Needs: Not on file  Physical Activity: Not on file  Stress: Not on file  Social Connections: Not on file  Intimate Partner Violence: Not on file    Family History  Problem Relation Age of Onset   Hypertension Mother     Past Surgical History:  Procedure Laterality Date   MASS EXCISION Right 01/31/2022   Procedure: EXCISION RIGHT BACK MASS;  Surgeon: [name], MD;  Location: [facility];  Service: Plastics;  Laterality: Right;  45 minutes    ROS: Review of Systems Negative except as stated above  PHYSICAL EXAM: BP 128/87 (BP Location: Left Arm, Patient Position: Sitting, Cuff Size: Large)   Pulse 100   Temp 98.3 F (36.8 C)   Resp 16   Ht 5' 7.99" (1.727 m)   Wt 240 lb (108.9 kg)   SpO2 96%   BMI 36.50 kg/m   Physical Exam HENT:      Head: Normocephalic and atraumatic.     Right Ear: Tympanic membrane, ear canal and external ear normal.     Left Ear: Tympanic membrane, ear canal and external ear normal.     Nose: Nose normal.     Mouth/Throat:     Mouth: Mucous membranes are moist.     Pharynx: Oropharynx is clear.  Eyes:     Extraocular Movements: Extraocular movements intact.     Conjunctiva/sclera: Conjunctivae normal.     Pupils: Pupils are equal, round, and reactive to light.  Cardiovascular:     Rate and Rhythm: Normal rate and regular rhythm.     Pulses: Normal pulses.     Heart sounds: Normal heart sounds.  Pulmonary:     Effort: Pulmonary effort is normal.     Breath sounds: Normal breath sounds.  Abdominal:     General: Bowel sounds are normal.     Palpations: Abdomen is soft.  Genitourinary:    Comments: Patient declined. Musculoskeletal:        General: Normal range of motion.     Right shoulder: Normal.     Left shoulder: Normal.     Right upper arm: Normal.  Left upper arm: Normal.     Right elbow: Normal.     Left elbow: Normal.     Right forearm: Normal.     Left forearm: Normal.     Right wrist: Normal.     Left wrist: Normal.     Right hand: Normal.     Left hand: Normal.     Cervical back: Normal, normal range of motion and neck supple.     Thoracic back: Normal.     Lumbar back: Normal.     Right hip: Normal.     Left hip: Normal.     Right upper leg: Normal.     Left upper leg: Normal.     Right knee: Normal.     Left knee: Normal.     Left lower leg: Normal.     Right ankle: Normal.     Left ankle: Normal.     Right foot: Normal.     Left foot: Normal.  Skin:    General: Skin is warm and dry.     Capillary Refill: Capillary refill takes less than 2 seconds.  Neurological:     General: No focal deficit present.     Mental Status: He is alert and oriented to person, place, and time.  Psychiatric:        Mood and Affect: Mood normal.        Behavior: Behavior  normal.    ASSESSMENT AND PLAN: 1. Annual physical exam - Counseled on 150 minutes of exercise per week as tolerated, healthy eating (including decreased daily intake of saturated fats, cholesterol, added sugars, sodium), STI prevention, and routine healthcare maintenance.  2. Screening for metabolic disorder - Routine screening.  - CMP14+EGFR  3. Screening for deficiency anemia - Routine screening. - CBC  4. Screening cholesterol level - Routine screening.  - Lipid panel  5. Thyroid disorder screen - Routine screening.  - TSH  6. Prediabetes - Routine screening.  - Hemoglobin A1c   Patient was given the opportunity to ask questions.  Patient verbalized understanding of the plan and was able to repeat key elements of the plan. Patient was given clear instructions to go to Emergency Department or return to medical center if symptoms don't improve, worsen, or new problems develop.The patient verbalized understanding.   Orders Placed This Encounter  Procedures   CBC   Lipid panel   TSH   CMP14+EGFR   Hemoglobin A1c   Return in about 1 year (around 10/31/2023) for Physical per patient preference.  Camillia Herter, NP

## 2022-10-30 ENCOUNTER — Encounter: Payer: Self-pay | Admitting: Family

## 2022-10-30 ENCOUNTER — Ambulatory Visit (INDEPENDENT_AMBULATORY_CARE_PROVIDER_SITE_OTHER): Payer: Medicaid Other | Admitting: Family

## 2022-10-30 VITALS — BP 128/87 | HR 100 | Temp 98.3°F | Resp 16 | Ht 67.99 in | Wt 240.0 lb

## 2022-10-30 DIAGNOSIS — R7303 Prediabetes: Secondary | ICD-10-CM

## 2022-10-30 DIAGNOSIS — Z130 Encounter for screening for diseases of the blood and blood-forming organs and certain disorders involving the immune mechanism: Secondary | ICD-10-CM | POA: Diagnosis not present

## 2022-10-30 DIAGNOSIS — Z1329 Encounter for screening for other suspected endocrine disorder: Secondary | ICD-10-CM

## 2022-10-30 DIAGNOSIS — Z13220 Encounter for screening for lipoid disorders: Secondary | ICD-10-CM | POA: Diagnosis not present

## 2022-10-30 DIAGNOSIS — Z0000 Encounter for general adult medical examination without abnormal findings: Secondary | ICD-10-CM

## 2022-10-30 DIAGNOSIS — Z23 Encounter for immunization: Secondary | ICD-10-CM | POA: Diagnosis not present

## 2022-10-30 DIAGNOSIS — Z13228 Encounter for screening for other metabolic disorders: Secondary | ICD-10-CM | POA: Diagnosis not present

## 2022-10-30 NOTE — Patient Instructions (Signed)
Preventive Care 21-24 Years Old, Male Preventive care refers to lifestyle choices and visits with your health care provider that can promote health and wellness. Preventive care visits are also called wellness exams. What can I expect for my preventive care visit? Counseling During your preventive care visit, your health care provider may ask about your: Medical history, including: Past medical problems. Family medical history. Current health, including: Emotional well-being. Home life and relationship well-being. Sexual activity. Lifestyle, including: Alcohol, nicotine or tobacco, and drug use. Access to firearms. Diet, exercise, and sleep habits. Safety issues such as seatbelt and bike helmet use. Sunscreen use. Work and work environment. Physical exam Your health care provider may check your: Height and weight. These may be used to calculate your BMI (body mass index). BMI is a measurement that tells if you are at a healthy weight. Waist circumference. This measures the distance around your waistline. This measurement also tells if you are at a healthy weight and may help predict your risk of certain diseases, such as type 2 diabetes and high blood pressure. Heart rate and blood pressure. Body temperature. Skin for abnormal spots. What immunizations do I need?  Vaccines are usually given at various ages, according to a schedule. Your health care provider will recommend vaccines for you based on your age, medical history, and lifestyle or other factors, such as travel or where you work. What tests do I need? Screening Your health care provider may recommend screening tests for certain conditions. This may include: Lipid and cholesterol levels. Diabetes screening. This is done by checking your blood sugar (glucose) after you have not eaten for a while (fasting). Hepatitis B test. Hepatitis C test. HIV (human immunodeficiency virus) test. STI (sexually transmitted infection)  testing, if you are at risk. Talk with your health care provider about your test results, treatment options, and if necessary, the need for more tests. Follow these instructions at home: Eating and drinking  Eat a healthy diet that includes fresh fruits and vegetables, whole grains, lean protein, and low-fat dairy products. Drink enough fluid to keep your urine pale yellow. Take vitamin and mineral supplements as recommended by your health care provider. Do not drink alcohol if your health care provider tells you not to drink. If you drink alcohol: Limit how much you have to 0-2 drinks a day. Know how much alcohol is in your drink. In the [place] one drink equals one 12 oz bottle of beer (355 mL), one 5 oz glass of wine (148 mL), or one 1 oz glass of hard liquor (44 mL). Lifestyle Brush your teeth every morning and night with fluoride toothpaste. Floss one time each day. Exercise for at least 30 minutes 5 or more days each week. Do not use any products that contain nicotine or tobacco. These products include cigarettes, chewing tobacco, and vaping devices, such as e-cigarettes. If you need help quitting, ask your health care provider. Do not use drugs. If you are sexually active, practice safe sex. Use a condom or other form of protection to prevent STIs. Find healthy ways to manage stress, such as: Meditation, yoga, or listening to music. Journaling. Talking to a trusted person. Spending time with friends and family. Minimize exposure to UV radiation to reduce your risk of skin cancer. Safety Always wear your seat belt while driving or riding in a vehicle. Do not drive: If you have been drinking alcohol. Do not ride with someone who has been drinking. If you have been using any mind-altering substances   or drugs. While texting. When you are tired or distracted. Wear a helmet and other protective equipment during sports activities. If you have firearms in your house, make sure you  follow all gun safety procedures. Seek help if you have been physically or sexually abused. What's next? Go to your health care provider once a year for an annual wellness visit. Ask your health care provider how often you should have your eyes and teeth checked. Stay up to date on all vaccines. This information is not intended to replace advice given to you by your health care provider. Make sure you discuss any questions you have with your health care provider. Document Revised: 06/08/2021 Document Reviewed: 06/08/2021 Elsevier Patient Education  2023 [employer]vier Inc.  

## 2022-10-30 NOTE — Progress Notes (Signed)
.  Pt presents for annual physical exam  

## 2022-10-31 LAB — CBC
Hematocrit: 45.4 % (ref 37.5–51.0)
Hemoglobin: 15.2 g/dL (ref 13.0–17.7)
MCH: 28.5 pg (ref 26.6–33.0)
MCHC: 33.5 g/dL (ref 31.5–35.7)
MCV: 85 fL (ref 79–97)
Platelets: 201 10*3/uL (ref 150–450)
RBC: 5.34 x10E6/uL (ref 4.14–5.80)
RDW: 12.8 % (ref 11.6–15.4)
WBC: 5.4 10*3/uL (ref 3.4–10.8)

## 2022-10-31 LAB — CMP14+EGFR
ALT: 30 IU/L (ref 0–44)
AST: 22 IU/L (ref 0–40)
Albumin/Globulin Ratio: 2.2 (ref 1.2–2.2)
Albumin: 4.8 g/dL (ref 4.3–5.2)
Alkaline Phosphatase: 67 IU/L (ref 44–121)
BUN/Creatinine Ratio: 9 (ref 9–20)
BUN: 12 mg/dL (ref 6–20)
Bilirubin Total: 0.9 mg/dL (ref 0.0–1.2)
CO2: 18 mmol/L — ABNORMAL LOW (ref 20–29)
Calcium: 10 mg/dL (ref 8.7–10.2)
Chloride: 101 mmol/L (ref 96–106)
Creatinine, Ser: 1.35 mg/dL — ABNORMAL HIGH (ref 0.76–1.27)
Globulin, Total: 2.2 g/dL (ref 1.5–4.5)
Glucose: 78 mg/dL (ref 70–99)
Potassium: 4.2 mmol/L (ref 3.5–5.2)
Sodium: 140 mmol/L (ref 134–144)
Total Protein: 7 g/dL (ref 6.0–8.5)
eGFR: 75 mL/min/{1.73_m2} (ref >59)

## 2022-10-31 LAB — LIPID PANEL
Chol/HDL Ratio: 4.3 ratio (ref 0.0–5.0)
Cholesterol, Total: 200 mg/dL — ABNORMAL HIGH (ref 100–199)
HDL: 46 mg/dL (ref >39)
LDL Chol Calc (NIH): 115 mg/dL — ABNORMAL HIGH (ref 0–99)
Triglycerides: 222 mg/dL — ABNORMAL HIGH (ref 0–149)
VLDL Cholesterol Cal: 39 mg/dL (ref 5–40)

## 2022-10-31 LAB — HEMOGLOBIN A1C
Est. average glucose Bld gHb Est-mCnc: 123 mg/dL
Hgb A1c MFr Bld: 5.9 % — ABNORMAL HIGH (ref 4.8–5.6)

## 2022-10-31 LAB — TSH: TSH: 1.52 u[IU]/mL (ref 0.450–4.500)

## 2022-11-09 DIAGNOSIS — F3289 Other specified depressive episodes: Secondary | ICD-10-CM | POA: Diagnosis not present

## 2022-11-23 DIAGNOSIS — F3289 Other specified depressive episodes: Secondary | ICD-10-CM | POA: Diagnosis not present

## 2023-01-17 DIAGNOSIS — F3289 Other specified depressive episodes: Secondary | ICD-10-CM | POA: Diagnosis not present

## 2023-01-31 ENCOUNTER — Ambulatory Visit
Admission: EM | Admit: 2023-01-31 | Discharge: 2023-01-31 | Disposition: A | Discharge status: Home / Self Care | Payer: Medicaid Other | Attending: Physician Assistant | Admitting: Physician Assistant

## 2023-01-31 DIAGNOSIS — K047 Periapical abscess without sinus: Secondary | ICD-10-CM | POA: Diagnosis not present

## 2023-01-31 MED ORDER — AMOXICILLIN 500 MG PO CAPS: 500.0000 mg | ORAL_CAPSULE | Freq: Three times a day (TID) | ORAL | 0 refills | Status: DC

## 2023-01-31 NOTE — ED Triage Notes (Signed)
Pt c/o right-side oral pain that has been happening for a few days now.

## 2023-01-31 NOTE — Discharge Instructions (Addendum)
  Please follow up with dentist as soon as possible.

## 2023-01-31 NOTE — ED Provider Notes (Signed)
[facility]    CSN: [id number] Arrival date & time: 01/31/23  0841      History   Chief Complaint Chief Complaint  Patient presents with   Oral Pain    HPI Chris Nelson is a 27 y.o. male.   Patient here today for evaluation of dental pain to his upper and lower right molars that started a few days ago. He reports he has had worsening pain despite use of OTC pain relievers. He has not had any fever. He does not currently have a dentist.   The history is provided by the patient.    Past Medical History:  Diagnosis Date   ADHD (attention deficit hyperactivity disorder)    Premature birth     Patient Active Problem List   Diagnosis Date Noted   Prediabetes 09/13/2021    Past Surgical History:  Procedure Laterality Date   MASS EXCISION Right 01/31/2022   Procedure: EXCISION RIGHT BACK MASS;  Surgeon: [name], MD;  Location: [facility];  Service: Plastics;  Laterality: Right;  45 minutes       Home Medications    Prior to Admission medications   Medication Sig Start Date End Date Taking? Authorizing Provider  amoxicillin (AMOXIL) 500 MG capsule Take 1 capsule (500 mg total) by mouth 3 (three) times daily. 01/31/23  Yes Francene Finders, PA-C    Family History Family History  Problem Relation Age of Onset   Hypertension Mother     Social History Social History   Tobacco Use   Smoking status: Never    Passive exposure: Never   Smokeless tobacco: Never  Vaping Use   Vaping Use: Never used  Substance Use Topics   Alcohol use: Not Currently   Drug use: Never     Allergies   Patient has no known allergies.   Review of Systems Review of Systems  Constitutional:  Negative for chills and fever.  HENT:  Positive for dental problem and facial swelling.   Eyes:  Negative for discharge and redness.  Respiratory:  Negative for shortness of breath.   Gastrointestinal:  Negative for nausea and vomiting.  Neurological:  Negative for numbness.      Physical Exam Triage Vital Signs ED Triage Vitals  Enc Vitals Group     BP      Pulse      Resp      Temp      Temp src      SpO2      Weight      Height      Head Circumference      Peak Flow      Pain Score      Pain Loc      Pain Edu?      Excl. in [place]?    No data found.  Updated Vital Signs BP (!) 150/90 (BP Location: Left Arm)   Pulse 83   Temp 99.1 F (37.3 C) (Oral)   Resp 16   SpO2 97%     Physical Exam Vitals and nursing note reviewed.  Constitutional:      General: He is not in acute distress.    Appearance: Normal appearance. He is not ill-appearing.  HENT:     Head: Normocephalic and atraumatic.     Mouth/Throat:     Mouth: Mucous membranes are moist.     Pharynx: Oropharynx is clear. No oropharyngeal exudate or posterior oropharyngeal erythema.     Comments: Mild swelling  to right maxillary face, gingival swelling diffusely to right upper and lower gum line, multiple caries noted Eyes:     Conjunctiva/sclera: Conjunctivae normal.  Cardiovascular:     Rate and Rhythm: Normal rate.  Pulmonary:     Effort: Pulmonary effort is normal.  Neurological:     Mental Status: He is alert.  Psychiatric:        Mood and Affect: Mood normal.        Behavior: Behavior normal.        Thought Content: Thought content normal.      UC Treatments / Results  Labs (all labs ordered are listed, but only abnormal results are displayed) Labs Reviewed - No data to display  EKG   Radiology No results found.  Procedures Procedures (including critical care time)  Medications Ordered in UC Medications - No data to display  Initial Impression / Assessment and Plan / UC Course  I have reviewed the triage vital signs and the nursing notes.  Pertinent labs & imaging results that were available during my care of the patient were reviewed by me and considered in my medical decision making (see chart for details).    Will treat to cover abscess and  recommended tylenol and ibuprofen as needed for pain and follow up with dentist as soon as possible. Patient expresses understanding.  Final Clinical Impressions(s) / UC Diagnoses   Final diagnoses:  Dental abscess     Discharge Instructions       Please follow up with dentist as soon as possible.      ED Prescriptions     Medication Sig Dispense Auth. Provider   amoxicillin (AMOXIL) 500 MG capsule Take 1 capsule (500 mg total) by mouth 3 (three) times daily. 21 capsule Francene Finders, PA-C      PDMP not reviewed this encounter.   Francene Finders, PA-C 01/31/23 1112

## 2023-02-07 DIAGNOSIS — F3289 Other specified depressive episodes: Secondary | ICD-10-CM | POA: Diagnosis not present

## 2023-02-15 DIAGNOSIS — F3289 Other specified depressive episodes: Secondary | ICD-10-CM | POA: Diagnosis not present

## 2023-02-21 DIAGNOSIS — F3289 Other specified depressive episodes: Secondary | ICD-10-CM | POA: Diagnosis not present

## 2023-02-23 DIAGNOSIS — F3289 Other specified depressive episodes: Secondary | ICD-10-CM | POA: Diagnosis not present

## 2023-02-27 DIAGNOSIS — F3289 Other specified depressive episodes: Secondary | ICD-10-CM | POA: Diagnosis not present

## 2023-03-05 DIAGNOSIS — F3289 Other specified depressive episodes: Secondary | ICD-10-CM | POA: Diagnosis not present

## 2023-03-08 DIAGNOSIS — F3289 Other specified depressive episodes: Secondary | ICD-10-CM | POA: Diagnosis not present

## 2023-03-14 DIAGNOSIS — F3289 Other specified depressive episodes: Secondary | ICD-10-CM | POA: Diagnosis not present

## 2023-03-15 DIAGNOSIS — F3289 Other specified depressive episodes: Secondary | ICD-10-CM | POA: Diagnosis not present

## 2023-03-20 DIAGNOSIS — F3289 Other specified depressive episodes: Secondary | ICD-10-CM | POA: Diagnosis not present

## 2023-03-26 DIAGNOSIS — F3289 Other specified depressive episodes: Secondary | ICD-10-CM | POA: Diagnosis not present

## 2023-03-30 DIAGNOSIS — F3289 Other specified depressive episodes: Secondary | ICD-10-CM | POA: Diagnosis not present

## 2023-06-20 DIAGNOSIS — F3289 Other specified depressive episodes: Secondary | ICD-10-CM | POA: Diagnosis not present

## 2023-06-22 DIAGNOSIS — F3289 Other specified depressive episodes: Secondary | ICD-10-CM | POA: Diagnosis not present

## 2023-07-20 IMAGING — MR MR CHEST MEDIASTINUM WO/W CM
10 series · 16 of 16 positions shown · IV contrast (gadavist)
Comparison: None.

CLINICAL DATA: Chest wall mass.

EXAM:
MR CHEST WITH AND WITHOUT CONTRAST
TECHNIQUE: Multiplanar, multisequence MR imaging of the right chest wall was
performed before and after the administration of intravenous
contrast.
CONTRAST:  10mL GADAVIST GADOBUTROL 1 MMOL/ML IV SOLN

[Series 3: T1 · coronal · 6.0mm · 1.17mm/px · 2 of 18 slices shown (1 of 2)]
[im 1/18]
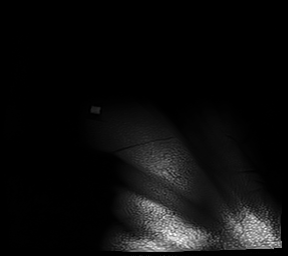
[im 18/18]
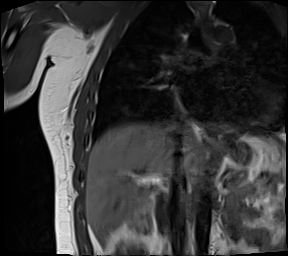

[Series 4: STIR · coronal · 6.0mm · 1.02mm/px · 1 of 18 slices shown (1 of 2)]
[im 1/18]
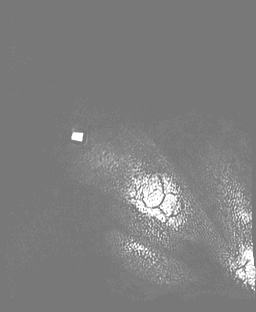

[Series 5: T1 · axial · 4.0mm · 0.86mm/px · z∈[-46,+177]mm · 2 of 44 slices shown (2 of 2)]
[im 1/44]
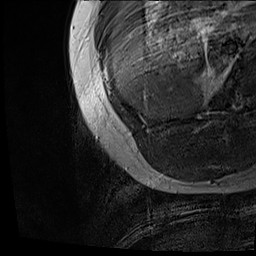
[im 44/44]
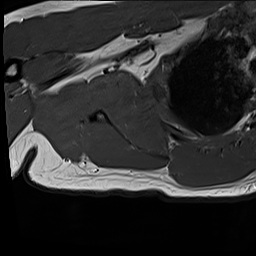

[Series 6: STIR · axial · 4.0mm · 0.86mm/px · z∈[-46,+177]mm · 2 of 44 slices shown (2 of 2)]
[im 1/44]
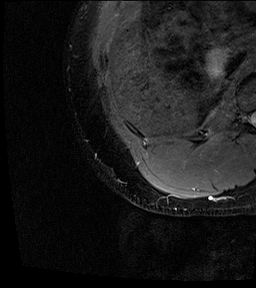
[im 44/44]
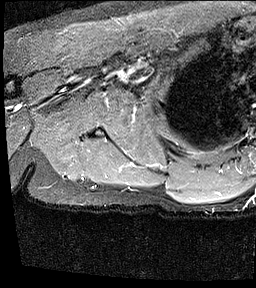

[Series 8: T2 · sagittal · 5.5mm · 1.25mm/px · 1 of 20 slices shown]
[im 1/20]
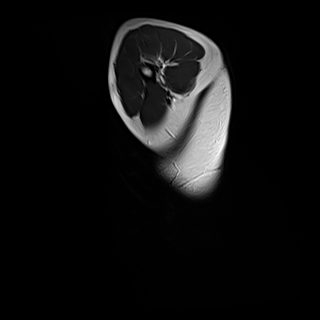

[Series 9: T1 fat-sat · axial · non-contrast · 4.0mm · 1.02mm/px · z∈[-47,+177]mm · 2 of 44 slices shown]
[im 1/44]
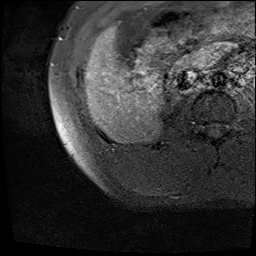
[im 44/44]
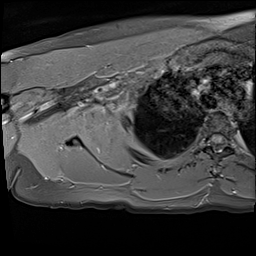

[Series 10: T1 fat-sat post-contrast · axial · 4.0mm · 1.02mm/px · z∈[-47,+177]mm · 2 of 44 slices shown (1 of 2)]
[im 1/44]
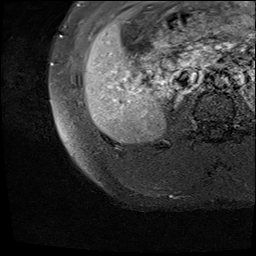
[im 44/44]
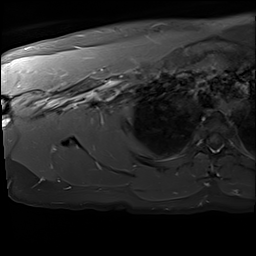

[Series 11: cor t1_fs_post · coronal · 6.0mm · 1.17mm/px · 1 of 18 slices shown]
[im 1/18]
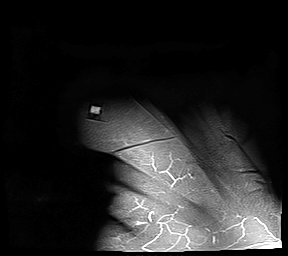

[Series 12: T1 fat-sat post-contrast · sagittal · 5.5mm · 1.09mm/px · 1 of 20 slices shown (2 of 2)]
[im 1/20]
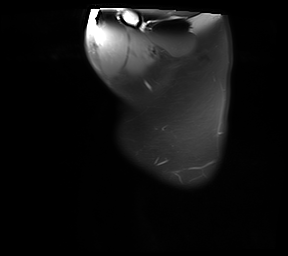

[Series 100: sub_s10-s9_1 · axial · 4.0mm · 1.02mm/px · z∈[-47,+177]mm · 2 of 44 slices shown]
[im 1/44]
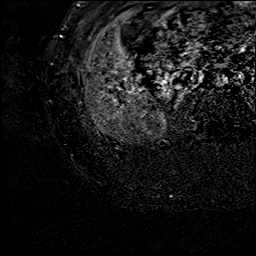
[im 44/44]
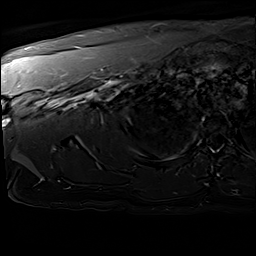

[16 of 16 positions shown; findings below may reference images not displayed]

FINDINGS: Bones/Joint/Cartilage

No marrow signal abnormality. No fracture.

Muscles and Tendons
Intact.  No muscle edema or atrophy.

Soft tissue
In the right posterior superficial chest wall just inferior to the
scapula, there is a 5.2 x 11.2 x 11.2 cm well-defined fat signal
intensity mass with a few thin internal septations. No internal
nodular T2 foci or enhancement.
IMPRESSION: 1. 11.2 cm lipoma in the right posterior superficial chest wall just
inferior to the scapula.

## 2023-07-25 ENCOUNTER — Ambulatory Visit (INDEPENDENT_AMBULATORY_CARE_PROVIDER_SITE_OTHER): Payer: Medicaid Other | Admitting: Family

## 2023-07-25 ENCOUNTER — Encounter: Payer: Self-pay | Admitting: Family

## 2023-07-25 VITALS — BP 137/88 | HR 82 | Temp 97.9°F | Ht 68.0 in | Wt 250.0 lb

## 2023-07-25 DIAGNOSIS — R7303 Prediabetes: Secondary | ICD-10-CM | POA: Diagnosis not present

## 2023-07-25 DIAGNOSIS — M549 Dorsalgia, unspecified: Secondary | ICD-10-CM

## 2023-07-25 DIAGNOSIS — R5383 Other fatigue: Secondary | ICD-10-CM | POA: Diagnosis not present

## 2023-07-25 DIAGNOSIS — G43909 Migraine, unspecified, not intractable, without status migrainosus: Secondary | ICD-10-CM

## 2023-07-25 MED ORDER — SUMATRIPTAN SUCCINATE 25 MG PO TABS: ORAL_TABLET | ORAL | 1 refills | Status: AC

## 2023-07-25 MED ORDER — PREDNISONE 10 MG PO TABS
20.0000 mg | ORAL_TABLET | Freq: Every day | ORAL | 0 refills | Status: AC
Start: 2023-07-25 — End: 2023-07-31

## 2023-07-25 MED ORDER — MELOXICAM 7.5 MG PO TABS: 7.5000 mg | ORAL_TABLET | Freq: Every day | ORAL | 1 refills | Status: DC

## 2023-07-25 NOTE — Progress Notes (Signed)
Patient ID: GREAT ISOBE, male    DOB: 1996-11-04  MRN: [id number]  CC: Back Pain  Subjective: Chris Nelson is a 27 y.o. male who presents for back pain.   His concerns today include:  - Upper back pain radiating to neck x 1 month. He denies recent trauma/injury and red flag symptoms. States symptoms began shortly after lifting items while helping his mother move into a new house. Taking over-the-counter medications with minimal relief. - Intermittent headaches. He denies associated red flag symptoms. Would like to try a medication to help. - Fatigue. Reports he is getting adequate rest/water intake.  - No further issues/concerns for discussion today.   Patient Active Problem List   Diagnosis Date Noted   Prediabetes 09/13/2021     Current Outpatient Medications on File Prior to Visit  Medication Sig Dispense Refill   amoxicillin (AMOXIL) 500 MG capsule Take 1 capsule (500 mg total) by mouth 3 (three) times daily. (Patient not taking: Reported on 07/25/2023) 21 capsule 0   No current facility-administered medications on file prior to visit.    No Known Allergies  Social History   Socioeconomic History   Marital status: Single    Spouse name: Not on file   Number of children: Not on file   Years of education: Not on file   Highest education level: Not on file  Occupational History   Not on file  Tobacco Use   Smoking status: Never    Passive exposure: Never   Smokeless tobacco: Never  Vaping Use   Vaping status: Never Used  Substance and Sexual Activity   Alcohol use: Not Currently   Drug use: Never   Sexual activity: Not on file  Other Topics Concern   Not on file  Social History Narrative   Not on file   Social Determinants of Health   Financial Resource Strain: Not on file  Food Insecurity: Not on file  Transportation Needs: Not on file  Physical Activity: Not on file  Stress: Not on file  Social Connections: Not on file  Intimate Partner Violence:  Not on file    Family History  Problem Relation Age of Onset   Hypertension Mother     Past Surgical History:  Procedure Laterality Date   MASS EXCISION Right 01/31/2022   Procedure: EXCISION RIGHT BACK MASS;  Surgeon: Janne Napoleon, MD;  Location: [facility];  Service: Plastics;  Laterality: Right;  45 minutes    ROS: Review of Systems Negative except as stated above  PHYSICAL EXAM: BP 137/88   Pulse 82   Temp 97.9 F (36.6 C) (Oral)   Ht 5\' 8"  (1.727 m)   Wt 250 lb (113.4 kg)   SpO2 95%   BMI 38.01 kg/m   Physical Exam HENT:     Head: Normocephalic and atraumatic.     Nose: Nose normal.     Mouth/Throat:     Mouth: Mucous membranes are moist.     Pharynx: Oropharynx is clear.  Eyes:     Extraocular Movements: Extraocular movements intact.     Conjunctiva/sclera: Conjunctivae normal.     Pupils: Pupils are equal, round, and reactive to light.  Cardiovascular:     Rate and Rhythm: Normal rate and regular rhythm.     Pulses: Normal pulses.     Heart sounds: Normal heart sounds.  Pulmonary:     Effort: Pulmonary effort is normal.     Breath sounds: Normal breath sounds.  Musculoskeletal:  General: Normal range of motion.     Right shoulder: Normal.     Left shoulder: Normal.     Right upper arm: Normal.     Left upper arm: Normal.     Right elbow: Normal.     Left elbow: Normal.     Right forearm: Normal.     Left forearm: Normal.     Right wrist: Normal.     Left wrist: Normal.     Right hand: Normal.     Left hand: Normal.     Cervical back: Normal, normal range of motion and neck supple.     Thoracic back: Normal.     Lumbar back: Normal.     Right hip: Normal.     Left hip: Normal.     Right upper leg: Normal.     Left upper leg: Normal.     Right knee: Normal.     Left knee: Normal.     Right lower leg: Normal.     Left lower leg: Normal.     Right ankle: Normal.     Left ankle: Normal.     Right foot: Normal.     Left foot: Normal.   Neurological:     General: No focal deficit present.     Mental Status: He is alert and oriented to person, place, and time.  Psychiatric:        Mood and Affect: Mood normal.        Behavior: Behavior normal.     ASSESSMENT AND PLAN: 1. Acute back pain, unspecified back location, unspecified back pain laterality - Meloxicam and Prednisone as prescribed. Counseled on medication adherence/adverse effects.  - Follow-up with primary provider in 4 weeks or sooner if needed.  - meloxicam (MOBIC) 7.5 MG tablet; Take 1 tablet (7.5 mg total) by mouth daily.  Dispense: 30 tablet; Refill: 1 - predniSONE (DELTASONE) 10 MG tablet; Take 2 tablets (20 mg total) by mouth daily with breakfast for 6 days.  Dispense: 12 tablet; Refill: 0  2. Migraine without status migrainosus, not intractable, unspecified migraine type - Sumatriptan as prescribed. Counseled on medication adherence/adverse effects.  - Follow-up with primary provider in 4 weeks or sooner if needed.  - SUMAtriptan (IMITREX) 25 MG tablet; Take 25 mg (1 tablet total) by mouth at the start of the headache. May repeat in 2 hours x 1 if headache persists. Max of 2 tablets/24 hours.  Dispense: 30 tablet; Refill: 1  3. Fatigue, unspecified type - Routine screening.  - Comprehensive metabolic panel - TSH - Vitamin D, 25-hydroxy - Lipid panel - CBC  4. Prediabetes - Routine screening.  - Hemoglobin A1c    Patient was given the opportunity to ask questions.  Patient verbalized understanding of the plan and was able to repeat key elements of the plan. Patient was given clear instructions to go to Emergency Department or return to medical center if symptoms don't improve, worsen, or new problems develop.The patient verbalized understanding.   Orders Placed This Encounter  Procedures   Comprehensive metabolic panel   TSH   Vitamin D, 25-hydroxy   Lipid panel   Hemoglobin A1c     Requested Prescriptions   Signed Prescriptions Disp  Refills   SUMAtriptan (IMITREX) 25 MG tablet 30 tablet 1    Sig: Take 25 mg (1 tablet total) by mouth at the start of the headache. May repeat in 2 hours x 1 if headache persists. Max of 2 tablets/24 hours.  meloxicam (MOBIC) 7.5 MG tablet 30 tablet 1    Sig: Take 1 tablet (7.5 mg total) by mouth daily.   predniSONE (DELTASONE) 10 MG tablet 12 tablet 0    Sig: Take 2 tablets (20 mg total) by mouth daily with breakfast for 6 days.    Return in about 4 weeks (around 08/22/2023) for Follow-Up or next available back pain/migraines.  Rema Fendt, NP

## 2023-07-25 NOTE — Progress Notes (Signed)
Pt states when he gets up his back and neck hurts.   Pt states he is still getting migraines and headaches.

## 2023-07-26 ENCOUNTER — Other Ambulatory Visit: Payer: Self-pay | Admitting: Family

## 2023-07-26 DIAGNOSIS — E559 Vitamin D deficiency, unspecified: Secondary | ICD-10-CM

## 2023-07-26 MED ORDER — VITAMIN D (ERGOCALCIFEROL) 1.25 MG (50000 UNIT) PO CAPS: 50000.0000 [IU] | ORAL_CAPSULE | ORAL | 0 refills | Status: AC

## 2023-07-27 DIAGNOSIS — F3289 Other specified depressive episodes: Secondary | ICD-10-CM | POA: Diagnosis not present

## 2023-08-18 DIAGNOSIS — F3289 Other specified depressive episodes: Secondary | ICD-10-CM | POA: Diagnosis not present

## 2023-08-22 ENCOUNTER — Ambulatory Visit (INDEPENDENT_AMBULATORY_CARE_PROVIDER_SITE_OTHER): Payer: Medicaid Other | Admitting: Family

## 2023-08-22 VITALS — BP 133/86 | HR 84 | Temp 97.9°F | Ht 69.0 in | Wt 257.0 lb

## 2023-08-22 DIAGNOSIS — M549 Dorsalgia, unspecified: Secondary | ICD-10-CM | POA: Diagnosis not present

## 2023-08-22 MED ORDER — MELOXICAM 15 MG PO TABS: 15.0000 mg | ORAL_TABLET | Freq: Every day | ORAL | 1 refills | Status: DC

## 2023-08-22 NOTE — Progress Notes (Unsigned)
Pt states back is still hurting when he wakes up.

## 2023-08-22 NOTE — Progress Notes (Unsigned)
Patient ID: Chris Nelson, male    DOB: [birth year]  MRN: [id number]  CC: No chief complaint on file.   Subjective: Chris Nelson is a 27 y.o. male who presents for His concerns today include: ***  Patient Active Problem List   Diagnosis Date Noted   Vitamin D deficiency 07/26/2023   Prediabetes 09/13/2021     Current Outpatient Medications on File Prior to Visit  Medication Sig Dispense Refill   amoxicillin (AMOXIL) 500 MG capsule Take 1 capsule (500 mg total) by mouth 3 (three) times daily. (Patient not taking: Reported on 07/25/2023) 21 capsule 0   meloxicam (MOBIC) 7.5 MG tablet Take 1 tablet (7.5 mg total) by mouth daily. 30 tablet 1   SUMAtriptan (IMITREX) 25 MG tablet Take 25 mg (1 tablet total) by mouth at the start of the headache. May repeat in 2 hours x 1 if headache persists. Max of 2 tablets/24 hours. 30 tablet 1   Vitamin D, Ergocalciferol, (DRISDOL) 1.25 MG (50000 UNIT) CAPS capsule Take 1 capsule (50,000 Units total) by mouth every 7 (seven) days for 12 doses. 12 capsule 0   No current facility-administered medications on file prior to visit.    No Known Allergies  Social History   Socioeconomic History   Marital status: Single    Spouse name: Not on file   Number of children: Not on file   Years of education: Not on file   Highest education level: Not on file  Occupational History   Not on file  Tobacco Use   Smoking status: Never    Passive exposure: Never   Smokeless tobacco: Never  Vaping Use   Vaping status: Never Used  Substance and Sexual Activity   Alcohol use: Not Currently   Drug use: Never   Sexual activity: Not on file  Other Topics Concern   Not on file  Social History Narrative   Not on file   Social Determinants of Health   Financial Resource Strain: Not on file  Food Insecurity: Not on file  Transportation Needs: Not on file  Physical Activity: Not on file  Stress: Not on file  Social Connections: Not on file  Intimate  Partner Violence: Not on file    Family History  Problem Relation Age of Onset   Hypertension Mother     Past Surgical History:  Procedure Laterality Date   MASS EXCISION Right 01/31/2022   Procedure: EXCISION RIGHT BACK MASS;  Surgeon: Janne Napoleon, MD;  Location: [facility] OR;  Service: Plastics;  Laterality: Right;  45 minutes    ROS: Review of Systems Negative except as stated above  PHYSICAL EXAM: There were no vitals taken for this visit.  Physical Exam  {male adult master:310786} {male adult master:310785}     Latest Ref Rng & Units 07/25/2023   11:22 AM 10/30/2022    2:44 PM 09/12/2021   11:28 AM  CMP  Glucose 70 - 99 mg/dL 93  78  86   BUN 6 - 20 mg/dL 12  12  13    Creatinine 0.76 - 1.27 mg/dL 2.72  5.36  6.44   Sodium 134 - 144 mmol/L 140  140  141   Potassium 3.5 - 5.2 mmol/L 4.4  4.2  4.8   Chloride 96 - 106 mmol/L 103  101  100   CO2 20 - 29 mmol/L 24  18  26    Calcium 8.7 - 10.2 mg/dL 03.4  74.2  59.5   Total Protein 6.0 -  8.5 g/dL 7.0  7.0  7.3   Total Bilirubin 0.0 - 1.2 mg/dL 1.3  0.9  1.2   Alkaline Phos 44 - 121 IU/L 60  67  61   AST 0 - 40 IU/L 21  22  38   ALT 0 - 44 IU/L 26  30  58    Lipid Panel     Component Value Date/Time   CHOL 190 07/25/2023 1122   TRIG 113 07/25/2023 1122   HDL 43 07/25/2023 1122   CHOLHDL 4.4 07/25/2023 1122   LDLCALC 127 (H) 07/25/2023 1122    CBC    Component Value Date/Time   WBC 4.0 07/25/2023 1122   WBC 4.6 01/31/2022 1017   RBC 5.41 07/25/2023 1122   RBC 5.49 01/31/2022 1017   HGB 15.0 07/25/2023 1122   HCT 45.9 07/25/2023 1122   PLT 183 07/25/2023 1122   MCV 85 07/25/2023 1122   MCH 27.7 07/25/2023 1122   MCH 28.6 01/31/2022 1017   MCHC 32.7 07/25/2023 1122   MCHC 33.1 01/31/2022 1017   RDW 12.8 07/25/2023 1122    ASSESSMENT AND PLAN:  There are no diagnoses linked to this encounter.   Patient was given the opportunity to ask questions.  Patient verbalized understanding of the plan and was  able to repeat key elements of the plan. Patient was given clear instructions to go to Emergency Department or return to medical center if symptoms don't improve, worsen, or new problems develop.The patient verbalized understanding.   No orders of the defined types were placed in this encounter.    Requested Prescriptions    No prescriptions requested or ordered in this encounter    No follow-ups on file.  Chris Fendt, NP

## 2023-09-03 ENCOUNTER — Ambulatory Visit: Payer: Medicaid Other | Admitting: Physical Medicine and Rehabilitation

## 2023-10-24 ENCOUNTER — Other Ambulatory Visit: Payer: Self-pay | Admitting: Family

## 2023-10-24 DIAGNOSIS — E559 Vitamin D deficiency, unspecified: Secondary | ICD-10-CM

## 2023-10-25 NOTE — Telephone Encounter (Signed)
Schedule appointment?

## 2023-10-26 NOTE — Telephone Encounter (Signed)
Schedule appointment?

## 2023-10-29 NOTE — Telephone Encounter (Signed)
Schedule appointment?

## 2023-11-05 ENCOUNTER — Encounter: Payer: Self-pay | Admitting: Family

## 2023-11-05 ENCOUNTER — Ambulatory Visit (INDEPENDENT_AMBULATORY_CARE_PROVIDER_SITE_OTHER): Payer: Medicaid Other | Admitting: Family

## 2023-11-05 VITALS — BP 125/82 | HR 100 | Temp 98.2°F | Ht 69.0 in | Wt 255.2 lb

## 2023-11-05 DIAGNOSIS — F419 Anxiety disorder, unspecified: Secondary | ICD-10-CM

## 2023-11-05 DIAGNOSIS — E559 Vitamin D deficiency, unspecified: Secondary | ICD-10-CM | POA: Diagnosis not present

## 2023-11-05 DIAGNOSIS — Z0000 Encounter for general adult medical examination without abnormal findings: Secondary | ICD-10-CM | POA: Diagnosis not present

## 2023-11-05 DIAGNOSIS — Z23 Encounter for immunization: Secondary | ICD-10-CM | POA: Diagnosis not present

## 2023-11-05 DIAGNOSIS — Z13220 Encounter for screening for lipoid disorders: Secondary | ICD-10-CM | POA: Diagnosis not present

## 2023-11-05 DIAGNOSIS — F32A Depression, unspecified: Secondary | ICD-10-CM

## 2023-11-05 MED ORDER — ESCITALOPRAM OXALATE 10 MG PO TABS: 10.0000 mg | ORAL_TABLET | Freq: Every day | ORAL | 1 refills | Status: DC

## 2023-11-05 MED ORDER — HYDROXYZINE PAMOATE 25 MG PO CAPS: 25.0000 mg | ORAL_CAPSULE | Freq: Three times a day (TID) | ORAL | 1 refills | Status: DC | PRN

## 2023-11-05 NOTE — Progress Notes (Signed)
Patient states his family says his mood changes too quickly and want to know why it's happening.   Patient wants Flu vaccine.

## 2023-11-05 NOTE — Progress Notes (Signed)
Patient ID: Chris Nelson, male    DOB: 09/17/1996  MRN: [id number]  CC: Annual Exam  Subjective: Chris Nelson is a 27 y.o. male who presents for annual exam.   His concerns today include:  States anxiety when around "large crowds". States unsure of reasons for depression. States his family has noticed differences in his "mood". Reports he is established with a therapist who does not prescribe anxiety depression medication and would like to begin on today. He denies thoughts of self-harm, suicidal ideations, homicidal ideations.   Patient Active Problem List   Diagnosis Date Noted   Vitamin D deficiency 07/26/2023   Prediabetes 09/13/2021     Current Outpatient Medications on File Prior to Visit  Medication Sig Dispense Refill   meloxicam (MOBIC) 15 MG tablet Take 1 tablet (15 mg total) by mouth daily. 30 tablet 1   SUMAtriptan (IMITREX) 25 MG tablet Take 25 mg (1 tablet total) by mouth at the start of the headache. May repeat in 2 hours x 1 if headache persists. Max of 2 tablets/24 hours. 30 tablet 1   No current facility-administered medications on file prior to visit.    No Known Allergies  Social History   Socioeconomic History   Marital status: Single    Spouse name: Not on file   Number of children: Not on file   Years of education: Not on file   Highest education level: Not on file  Occupational History   Not on file  Tobacco Use   Smoking status: Never    Passive exposure: Never   Smokeless tobacco: Never  Vaping Use   Vaping status: Never Used  Substance and Sexual Activity   Alcohol use: Not Currently   Drug use: Never   Sexual activity: Not on file  Other Topics Concern   Not on file  Social History Narrative   Not on file   Social Determinants of Health   Financial Resource Strain: Not on file  Food Insecurity: No Food Insecurity (11/05/2023)   Hunger Vital Sign    Worried About Running Out of Food in the Last Year: Never true    Ran Out of  Food in the Last Year: Never true  Transportation Needs: No Transportation Needs (11/05/2023)   PRAPARE - Administrator, Civil Service (Medical): No    Lack of Transportation (Non-Medical): No  Physical Activity: Not on file  Stress: No Stress Concern Present (11/05/2023)   [employer] of Occupational Health - Occupational Stress Questionnaire    Feeling of Stress : Not at all  Social Connections: Not on file  Intimate Partner Violence: Not At Risk (11/05/2023)   Humiliation, Afraid, Rape, and Kick questionnaire    Fear of Current or Ex-Partner: No    Emotionally Abused: No    Physically Abused: No    Sexually Abused: No    Family History  Problem Relation Age of Onset   Hypertension Mother     Past Surgical History:  Procedure Laterality Date   MASS EXCISION Right 01/31/2022   Procedure: EXCISION RIGHT BACK MASS;  Surgeon: Janne Napoleon, MD;  Location: [facility] OR;  Service: Plastics;  Laterality: Right;  45 minutes    ROS: Review of Systems Negative except as stated above  PHYSICAL EXAM: BP 125/82   Pulse 100   Temp 98.2 F (36.8 C) (Oral)   Ht 5\' 9"  (1.753 m)   Wt 255 lb 3.2 oz (115.8 kg)   SpO2 95%  BMI 37.69 kg/m   Physical Exam HENT:     Head: Normocephalic and atraumatic.     Right Ear: Tympanic membrane, ear canal and external ear normal.     Left Ear: Tympanic membrane, ear canal and external ear normal.     Nose: Nose normal.     Mouth/Throat:     Mouth: Mucous membranes are moist.     Pharynx: Oropharynx is clear.  Eyes:     Extraocular Movements: Extraocular movements intact.     Conjunctiva/sclera: Conjunctivae normal.     Pupils: Pupils are equal, round, and reactive to light.  Neck:     Thyroid: No thyroid mass, thyromegaly or thyroid tenderness.  Cardiovascular:     Rate and Rhythm: Normal rate and regular rhythm.     Pulses: Normal pulses.     Heart sounds: Normal heart sounds.  Pulmonary:     Effort: Pulmonary effort is  normal.     Breath sounds: Normal breath sounds.  Abdominal:     General: Bowel sounds are normal.     Palpations: Abdomen is soft.  Genitourinary:    Comments: Patient declined.  Musculoskeletal:        General: Normal range of motion.     Right shoulder: Normal.     Left shoulder: Normal.     Right upper arm: Normal.     Left upper arm: Normal.     Right elbow: Normal.     Left elbow: Normal.     Right forearm: Normal.     Left forearm: Normal.     Right wrist: Normal.     Left wrist: Normal.     Right hand: Normal.     Left hand: Normal.     Cervical back: Normal, normal range of motion and neck supple.     Thoracic back: Normal.     Lumbar back: Normal.     Right hip: Normal.     Left hip: Normal.     Right upper leg: Normal.     Left upper leg: Normal.     Right knee: Normal.     Left knee: Normal.     Right lower leg: Normal.     Left lower leg: Normal.     Right ankle: Normal.     Left ankle: Normal.     Right foot: Normal.     Left foot: Normal.  Skin:    General: Skin is warm and dry.     Capillary Refill: Capillary refill takes less than 2 seconds.  Neurological:     General: No focal deficit present.     Mental Status: He is alert and oriented to person, place, and time.  Psychiatric:        Mood and Affect: Mood normal.        Behavior: Behavior normal.     ASSESSMENT AND PLAN: 1. Annual physical exam - Counseled on 150 minutes of exercise per week as tolerated, healthy eating (including decreased daily intake of saturated fats, cholesterol, added sugars, sodium), STI prevention, and routine healthcare maintenance.  2. Screening cholesterol level - Routine screening.  - Lipid panel  3. Vitamin D deficiency - Routine screening.  - Vitamin D, 25-hydroxy  4. Anxiety and depression - Patient denies thoughts of self-harm, suicidal ideations, homicidal ideations. - Escitalopram and Hydroxyzine as prescribed. Counseled on medication adherence/adverse  effects./  - Keep all scheduled appointments with therapist.  - Follow-up with primary provider in 4 weeks or sooner if  needed.  - escitalopram (LEXAPRO) 10 MG tablet; Take 1 tablet (10 mg total) by mouth at bedtime.  Dispense: 30 tablet; Refill: 1 - hydrOXYzine (VISTARIL) 25 MG capsule; Take 1 capsule (25 mg total) by mouth every 8 (eight) hours as needed.  Dispense: 30 capsule; Refill: 1  5. Encounter for immunization - Administered.  - Flu vaccine trivalent PF, 6mos and older(Flulaval,Afluria,Fluarix,Fluzone)     Patient was given the opportunity to ask questions.  Patient verbalized understanding of the plan and was able to repeat key elements of the plan. Patient was given clear instructions to go to Emergency Department or return to medical center if symptoms don't improve, worsen, or new problems develop.The patient verbalized understanding.   Orders Placed This Encounter  Procedures   Flu vaccine trivalent PF, 6mos and older(Flulaval,Afluria,Fluarix,Fluzone)   Lipid panel   Vitamin D, 25-hydroxy     Requested Prescriptions   Signed Prescriptions Disp Refills   escitalopram (LEXAPRO) 10 MG tablet 30 tablet 1    Sig: Take 1 tablet (10 mg total) by mouth at bedtime.   hydrOXYzine (VISTARIL) 25 MG capsule 30 capsule 1    Sig: Take 1 capsule (25 mg total) by mouth every 8 (eight) hours as needed.    Return in about 1 year (around 11/04/2024) for Physical per patient preference.  Rema Fendt, NP

## 2023-11-06 ENCOUNTER — Other Ambulatory Visit: Payer: Self-pay | Admitting: Family

## 2023-11-06 DIAGNOSIS — E785 Hyperlipidemia, unspecified: Secondary | ICD-10-CM

## 2023-11-06 LAB — LIPID PANEL
Chol/HDL Ratio: 4.7 ratio (ref 0.0–5.0)
Cholesterol, Total: 198 mg/dL (ref 100–199)
HDL: 42 mg/dL (ref >39)
LDL Chol Calc (NIH): 107 mg/dL — ABNORMAL HIGH (ref 0–99)
Triglycerides: 286 mg/dL — ABNORMAL HIGH (ref 0–149)
VLDL Cholesterol Cal: 49 mg/dL — ABNORMAL HIGH (ref 5–40)

## 2023-11-06 LAB — VITAMIN D 25 HYDROXY (VIT D DEFICIENCY, FRACTURES): Vit D, 25-Hydroxy: 30.1 ng/mL (ref 30.0–100.0)

## 2023-11-06 MED ORDER — ATORVASTATIN CALCIUM 20 MG PO TABS: 20.0000 mg | ORAL_TABLET | Freq: Every day | ORAL | 0 refills | Status: DC

## 2023-11-26 ENCOUNTER — Other Ambulatory Visit: Payer: Self-pay | Admitting: Family

## 2023-11-26 ENCOUNTER — Other Ambulatory Visit: Payer: Self-pay

## 2023-11-26 DIAGNOSIS — M549 Dorsalgia, unspecified: Secondary | ICD-10-CM

## 2023-11-26 MED ORDER — MELOXICAM 15 MG PO TABS: 15.0000 mg | ORAL_TABLET | Freq: Every day | ORAL | 1 refills | Status: DC

## 2023-11-28 ENCOUNTER — Other Ambulatory Visit: Payer: Self-pay | Admitting: Family

## 2023-11-28 DIAGNOSIS — F32A Depression, unspecified: Secondary | ICD-10-CM

## 2023-11-28 NOTE — Telephone Encounter (Signed)
Complete

## 2023-12-04 ENCOUNTER — Ambulatory Visit (INDEPENDENT_AMBULATORY_CARE_PROVIDER_SITE_OTHER): Payer: Medicaid Other | Admitting: Family

## 2023-12-04 ENCOUNTER — Encounter: Payer: Self-pay | Admitting: Family

## 2023-12-04 VITALS — BP 124/87 | HR 103 | Temp 98.4°F | Ht 70.0 in | Wt 253.4 lb

## 2023-12-04 DIAGNOSIS — F419 Anxiety disorder, unspecified: Secondary | ICD-10-CM | POA: Diagnosis not present

## 2023-12-04 DIAGNOSIS — F32A Depression, unspecified: Secondary | ICD-10-CM | POA: Diagnosis not present

## 2023-12-04 DIAGNOSIS — E785 Hyperlipidemia, unspecified: Secondary | ICD-10-CM

## 2023-12-04 NOTE — Progress Notes (Signed)
Asked about checking his cholesterol again.

## 2023-12-04 NOTE — Progress Notes (Signed)
Patient ID: WRIGHT MOLLE, male    DOB: 1996/07/01  MRN: [id number]  CC: Anxiety Depression Follow-Up  Subjective: Chris Nelson is a 27 y.o. male who presents for anxiety depression follow-up.   His concerns today include:  - Doing well on Escitalopram and Hydroxyzine, no issues/concerns. He denies thoughts of self-harm, suicidal ideations, homicidal ideations. - Doing well on Atorvastatin, no issues/concerns. States his church recently had a health fair and his cholesterol was checked and was told is normal.    Patient Active Problem List   Diagnosis Date Noted   Vitamin D deficiency 07/26/2023   Prediabetes 09/13/2021     Current Outpatient Medications on File Prior to Visit  Medication Sig Dispense Refill   atorvastatin (LIPITOR) 20 MG tablet Take 1 tablet (20 mg total) by mouth daily. 90 tablet 0   escitalopram (LEXAPRO) 10 MG tablet TAKE 1 TABLET BY MOUTH EVERYDAY AT BEDTIME 90 tablet 0   hydrOXYzine (VISTARIL) 25 MG capsule Take 1 capsule (25 mg total) by mouth every 8 (eight) hours as needed. 30 capsule 1   meloxicam (MOBIC) 15 MG tablet TAKE 1 TABLET (15 MG TOTAL) BY MOUTH DAILY. 30 tablet 1   meloxicam (MOBIC) 15 MG tablet Take 1 tablet (15 mg total) by mouth daily. 30 tablet 1   SUMAtriptan (IMITREX) 25 MG tablet Take 25 mg (1 tablet total) by mouth at the start of the headache. May repeat in 2 hours x 1 if headache persists. Max of 2 tablets/24 hours. 30 tablet 1   No current facility-administered medications on file prior to visit.    No Known Allergies  Social History   Socioeconomic History   Marital status: Single    Spouse name: Not on file   Number of children: Not on file   Years of education: Not on file   Highest education level: Not on file  Occupational History   Not on file  Tobacco Use   Smoking status: Never    Passive exposure: Never   Smokeless tobacco: Never  Vaping Use   Vaping status: Never Used  Substance and Sexual Activity    Alcohol use: Not Currently   Drug use: Never   Sexual activity: Not on file  Other Topics Concern   Not on file  Social History Narrative   Not on file   Social Determinants of Health   Financial Resource Strain: Not on file  Food Insecurity: No Food Insecurity (11/05/2023)   Hunger Vital Sign    Worried About Running Out of Food in the Last Year: Never true    Ran Out of Food in the Last Year: Never true  Transportation Needs: No Transportation Needs (11/05/2023)   PRAPARE - Administrator, Civil Service (Medical): No    Lack of Transportation (Non-Medical): No  Physical Activity: Not on file  Stress: No Stress Concern Present (11/05/2023)   [employer] of Occupational Health - Occupational Stress Questionnaire    Feeling of Stress : Not at all  Social Connections: Not on file  Intimate Partner Violence: Not At Risk (11/05/2023)   Humiliation, Afraid, Rape, and Kick questionnaire    Fear of Current or Ex-Partner: No    Emotionally Abused: No    Physically Abused: No    Sexually Abused: No    Family History  Problem Relation Age of Onset   Hypertension Mother     Past Surgical History:  Procedure Laterality Date   MASS EXCISION Right 01/31/2022  Procedure: EXCISION RIGHT BACK MASS;  Surgeon: Janne Napoleon, MD;  Location: [facility] OR;  Service: Plastics;  Laterality: Right;  45 minutes    ROS: Review of Systems Negative except as stated above  PHYSICAL EXAM: BP 124/87   Pulse (!) 103   Temp 98.4 F (36.9 C) (Oral)   Ht 5\' 10"  (1.778 m)   Wt 253 lb 6.4 oz (114.9 kg)   SpO2 96%   BMI 36.36 kg/m   Physical Exam HENT:     Head: Normocephalic and atraumatic.     Nose: Nose normal.     Mouth/Throat:     Mouth: Mucous membranes are moist.     Pharynx: Oropharynx is clear.  Eyes:     Extraocular Movements: Extraocular movements intact.     Conjunctiva/sclera: Conjunctivae normal.     Pupils: Pupils are equal, round, and reactive to light.   Cardiovascular:     Rate and Rhythm: Tachycardia present.     Pulses: Normal pulses.     Heart sounds: Normal heart sounds.  Pulmonary:     Effort: Pulmonary effort is normal.     Breath sounds: Normal breath sounds.  Musculoskeletal:        General: Normal range of motion.     Cervical back: Normal range of motion and neck supple.  Neurological:     General: No focal deficit present.     Mental Status: He is alert and oriented to person, place, and time.  Psychiatric:        Mood and Affect: Mood normal.        Behavior: Behavior normal.     ASSESSMENT AND PLAN: 1. Anxiety and depression - Patient denies thoughts of self-harm, suicidal ideations, homicidal ideations. - Continue Escitalopram and Hydroxyzine as prescribed. Counseled on medication adherence/adverse effects. No refills needed as of present.  - Follow-up with primary provider in 3 months or sooner if needed.   2. Hyperlipidemia, unspecified hyperlipidemia type - Continue Atorvastatin as prescribed. Counseled on medication adherence/adverse effects.  - Patient declined lipid panel.  - Follow-up with primary provider in 3 months or sooner if needed.    Patient was given the opportunity to ask questions.  Patient verbalized understanding of the plan and was able to repeat key elements of the plan. Patient was given clear instructions to go to Emergency Department or return to medical center if symptoms don't improve, worsen, or new problems develop.The patient verbalized understanding.   Return in about 3 months (around 03/03/2024) for Follow-Up or next available chronic conditions.  Rema Fendt, NP

## 2023-12-25 ENCOUNTER — Ambulatory Visit: Payer: Medicaid Other | Admitting: Family

## 2024-02-01 ENCOUNTER — Other Ambulatory Visit: Payer: Self-pay | Admitting: Family

## 2024-02-01 DIAGNOSIS — E785 Hyperlipidemia, unspecified: Secondary | ICD-10-CM

## 2024-02-01 NOTE — Telephone Encounter (Signed)
 Complete

## 2024-02-29 ENCOUNTER — Other Ambulatory Visit: Payer: Self-pay | Admitting: Family

## 2024-02-29 DIAGNOSIS — F32A Depression, unspecified: Secondary | ICD-10-CM

## 2024-03-21 ENCOUNTER — Other Ambulatory Visit: Payer: Self-pay | Admitting: Family

## 2024-03-21 DIAGNOSIS — F32A Anxiety disorder, unspecified: Secondary | ICD-10-CM

## 2024-03-21 NOTE — Telephone Encounter (Signed)
 Escitalopram prescribed 02/29/2024 for 90 day supply.

## 2024-06-20 ENCOUNTER — Other Ambulatory Visit: Payer: Self-pay | Admitting: Family

## 2024-06-20 DIAGNOSIS — E559 Vitamin D deficiency, unspecified: Secondary | ICD-10-CM

## 2024-06-23 NOTE — Telephone Encounter (Signed)
 Schedule appointment to recheck Vitamin D .

## 2024-06-24 NOTE — Telephone Encounter (Signed)
 Schedule appointment to recheck Vitamin D .

## 2024-06-25 NOTE — Telephone Encounter (Signed)
 Schedule appointment to recheck Vitamin D .

## 2024-06-30 NOTE — Telephone Encounter (Signed)
 Pt scheduled for labs

## 2024-06-30 NOTE — Telephone Encounter (Signed)
 Schedule appointment to recheck Vitamin D .

## 2024-07-02 ENCOUNTER — Other Ambulatory Visit (INDEPENDENT_AMBULATORY_CARE_PROVIDER_SITE_OTHER)

## 2024-07-02 DIAGNOSIS — E559 Vitamin D deficiency, unspecified: Secondary | ICD-10-CM | POA: Diagnosis not present

## 2024-07-02 NOTE — Progress Notes (Signed)
Patient came in for labs Patient tolerated well

## 2024-07-03 ENCOUNTER — Ambulatory Visit: Payer: Self-pay | Admitting: Family

## 2024-07-03 DIAGNOSIS — E559 Vitamin D deficiency, unspecified: Secondary | ICD-10-CM

## 2024-07-03 LAB — VITAMIN D 25 HYDROXY (VIT D DEFICIENCY, FRACTURES): Vit D, 25-Hydroxy: 23.4 ng/mL — ABNORMAL LOW (ref 30.0–100.0)

## 2024-07-03 MED ORDER — VITAMIN D (ERGOCALCIFEROL) 1.25 MG (50000 UNIT) PO CAPS: 50000.0000 [IU] | ORAL_CAPSULE | ORAL | 0 refills | Status: AC

## 2024-07-06 ENCOUNTER — Other Ambulatory Visit: Payer: Self-pay | Admitting: Family Medicine

## 2024-07-06 DIAGNOSIS — F419 Anxiety disorder, unspecified: Secondary | ICD-10-CM

## 2024-09-22 ENCOUNTER — Other Ambulatory Visit: Payer: Self-pay | Admitting: Family

## 2024-09-22 DIAGNOSIS — E559 Vitamin D deficiency, unspecified: Secondary | ICD-10-CM

## 2024-09-22 NOTE — Telephone Encounter (Signed)
 Pt scheduled for physical on 11/11

## 2024-09-22 NOTE — Telephone Encounter (Signed)
 Schedule appointment?

## 2024-09-22 NOTE — Telephone Encounter (Signed)
 Pt scheduled on provider's next available appt 11/07

## 2024-09-22 NOTE — Telephone Encounter (Signed)
Patient's preference.

## 2024-10-31 ENCOUNTER — Ambulatory Visit: Admitting: Family

## 2024-11-04 ENCOUNTER — Ambulatory Visit (INDEPENDENT_AMBULATORY_CARE_PROVIDER_SITE_OTHER): Payer: Medicaid Other | Admitting: Family

## 2024-11-04 ENCOUNTER — Encounter: Payer: Self-pay | Admitting: Family

## 2024-11-04 VITALS — BP 123/82 | HR 88 | Temp 98.4°F | Resp 16 | Ht 70.0 in | Wt 254.0 lb

## 2024-11-04 DIAGNOSIS — Z13220 Encounter for screening for lipoid disorders: Secondary | ICD-10-CM

## 2024-11-04 DIAGNOSIS — Z0000 Encounter for general adult medical examination without abnormal findings: Secondary | ICD-10-CM | POA: Diagnosis not present

## 2024-11-04 DIAGNOSIS — Z130 Encounter for screening for diseases of the blood and blood-forming organs and certain disorders involving the immune mechanism: Secondary | ICD-10-CM

## 2024-11-04 DIAGNOSIS — Z13228 Encounter for screening for other metabolic disorders: Secondary | ICD-10-CM

## 2024-11-04 DIAGNOSIS — Z131 Encounter for screening for diabetes mellitus: Secondary | ICD-10-CM

## 2024-11-04 DIAGNOSIS — Z1329 Encounter for screening for other suspected endocrine disorder: Secondary | ICD-10-CM

## 2024-11-04 NOTE — Progress Notes (Signed)
 Patient ID: Chris Nelson, male    DOB: [birth year]  MRN: [id number]  CC: Annual Exam  Subjective: Chris Nelson is a 28 y.o. male who presents for annual exam.   His concerns today include:  None.  Patient Active Problem List   Diagnosis Date Noted   Vitamin D  deficiency 07/26/2023   Prediabetes 09/13/2021     Current Outpatient Medications on File Prior to Visit  Medication Sig Dispense Refill   atorvastatin  (LIPITOR) 20 MG tablet TAKE 1 TABLET BY MOUTH EVERY DAY 90 tablet 0   escitalopram  (LEXAPRO ) 10 MG tablet TAKE 1 TABLET BY MOUTH EVERYDAY AT BEDTIME 90 tablet 0   hydrOXYzine  (VISTARIL ) 25 MG capsule Take 1 capsule (25 mg total) by mouth every 8 (eight) hours as needed. 30 capsule 1   meloxicam  (MOBIC ) 15 MG tablet TAKE 1 TABLET (15 MG TOTAL) BY MOUTH DAILY. 30 tablet 1   SUMAtriptan  (IMITREX ) 25 MG tablet Take 25 mg (1 tablet total) by mouth at the start of the headache. May repeat in 2 hours x 1 if headache persists. Max of 2 tablets/24 hours. 30 tablet 1   No current facility-administered medications on file prior to visit.    No Known Allergies  Social History   Socioeconomic History   Marital status: Single    Spouse name: Not on file   Number of children: Not on file   Years of education: Not on file   Highest education level: Not on file  Occupational History   Not on file  Tobacco Use   Smoking status: Never    Passive exposure: Never   Smokeless tobacco: Never  Vaping Use   Vaping status: Never Used  Substance and Sexual Activity   Alcohol use: Not Currently   Drug use: Never   Sexual activity: Not on file  Other Topics Concern   Not on file  Social History Narrative   Not on file   Social Drivers of Health   Financial Resource Strain: Low Risk  (11/04/2024)   Overall Financial Resource Strain (CARDIA)    Difficulty of Paying Living Expenses: Not hard at all  Food Insecurity: No Food Insecurity (11/04/2024)   Hunger Vital Sign    Worried  About Running Out of Food in the Last Year: Never true    Ran Out of Food in the Last Year: Never true  Transportation Needs: No Transportation Needs (11/05/2023)   PRAPARE - Administrator, Civil Service (Medical): No    Lack of Transportation (Non-Medical): No  Physical Activity: Sufficiently Active (11/04/2024)   Exercise Vital Sign    Days of Exercise per Week: 7 days    Minutes of Exercise per Session: 60 min  Stress: No Stress Concern Present (11/04/2024)   [employer] of Occupational Health - Occupational Stress Questionnaire    Feeling of Stress: Not at all  Social Connections: Moderately Integrated (11/04/2024)   Social Connection and Isolation Panel    Frequency of Communication with Friends and Family: Three times a week    Frequency of Social Gatherings with Friends and Family: Three times a week    Attends Religious Services: More than 4 times per year    Active Member of Clubs or Organizations: Yes    Attends Banker Meetings: More than 4 times per year    Marital Status: Never married  Intimate Partner Violence: Not At Risk (11/04/2024)   Humiliation, Afraid, Rape, and Kick questionnaire    Fear  of Current or Ex-Partner: No    Emotionally Abused: No    Physically Abused: No    Sexually Abused: No    Family History  Problem Relation Age of Onset   Hypertension Mother     Past Surgical History:  Procedure Laterality Date   MASS EXCISION Right 01/31/2022   Procedure: EXCISION RIGHT BACK MASS;  Surgeon: Marene Sieving, MD;  Location: [facility] OR;  Service: Plastics;  Laterality: Right;  45 minutes    ROS: Review of Systems Negative except as stated above  PHYSICAL EXAM: BP 123/82   Pulse 88   Temp 98.4 F (36.9 C) (Oral)   Resp 16   Ht 5' 10 (1.778 m)   Wt 254 lb (115.2 kg)   SpO2 95%   BMI 36.45 kg/m   Physical Exam HENT:     Head: Normocephalic and atraumatic.     Right Ear: Tympanic membrane, ear canal and external ear  normal.     Left Ear: Tympanic membrane, ear canal and external ear normal.     Nose: Nose normal.     Mouth/Throat:     Mouth: Mucous membranes are moist.     Pharynx: Oropharynx is clear.  Eyes:     Extraocular Movements: Extraocular movements intact.     Conjunctiva/sclera: Conjunctivae normal.     Pupils: Pupils are equal, round, and reactive to light.  Neck:     Thyroid : No thyroid  mass, thyromegaly or thyroid  tenderness.  Cardiovascular:     Rate and Rhythm: Normal rate and regular rhythm.     Pulses: Normal pulses.     Heart sounds: Normal heart sounds.  Pulmonary:     Effort: Pulmonary effort is normal.     Breath sounds: Normal breath sounds.  Abdominal:     General: Bowel sounds are normal.     Palpations: Abdomen is soft.  Genitourinary:    Comments: Patient declined. Musculoskeletal:        General: Normal range of motion.     Right shoulder: Normal.     Left shoulder: Normal.     Right upper arm: Normal.     Left upper arm: Normal.     Right elbow: Normal.     Left elbow: Normal.     Right forearm: Normal.     Left forearm: Normal.     Right wrist: Normal.     Left wrist: Normal.     Right hand: Normal.     Left hand: Normal.     Cervical back: Normal, normal range of motion and neck supple.     Thoracic back: Normal.     Lumbar back: Normal.     Right hip: Normal.     Left hip: Normal.     Right upper leg: Normal.     Left upper leg: Normal.     Right knee: Normal.     Left knee: Normal.     Right lower leg: Normal.     Left lower leg: Normal.     Right ankle: Normal.     Left ankle: Normal.     Right foot: Normal.     Left foot: Normal.  Skin:    General: Skin is warm and dry.     Capillary Refill: Capillary refill takes less than 2 seconds.  Neurological:     General: No focal deficit present.     Mental Status: He is alert and oriented to person, place, and time.  Psychiatric:  Mood and Affect: Mood normal.        Behavior: Behavior  normal.     ASSESSMENT AND PLAN: 1. Annual physical exam (Primary) - Counseled on 150 minutes of exercise per week as tolerated, healthy eating (including decreased daily intake of saturated fats, cholesterol, added sugars, sodium), STI prevention, and routine healthcare maintenance.  2. Screening for metabolic disorder - Routine screening.  - CMP14+EGFR  3. Screening for deficiency anemia - Routine screening.  - CBC  4. Diabetes mellitus screening - Routine screening.  - Hemoglobin A1c  5. Screening cholesterol level - Routine screening.  - Lipid panel  6. Thyroid  disorder screen - Routine screening.  - TSH   Patient was given the opportunity to ask questions.  Patient verbalized understanding of the plan and was able to repeat key elements of the plan. Patient was given clear instructions to go to Emergency Department or return to medical center if symptoms don't improve, worsen, or new problems develop.The patient verbalized understanding.   Orders Placed This Encounter  Procedures   CBC   Lipid panel   CMP14+EGFR   Hemoglobin A1c   TSH    Return in about 1 year (around 11/04/2025) for Physical per patient preference.  Greig JINNY Chute, NP

## 2024-11-05 ENCOUNTER — Ambulatory Visit: Payer: Self-pay | Admitting: Family

## 2024-11-05 DIAGNOSIS — Z13220 Encounter for screening for lipoid disorders: Secondary | ICD-10-CM

## 2024-11-05 LAB — CMP14+EGFR
ALT: 24 IU/L (ref 0–44)
AST: 22 IU/L (ref 0–40)
Albumin: 4.7 g/dL (ref 4.3–5.2)
Alkaline Phosphatase: 61 IU/L (ref 47–123)
BUN/Creatinine Ratio: 7 — ABNORMAL LOW (ref 9–20)
BUN: 11 mg/dL (ref 6–20)
Bilirubin Total: 1 mg/dL (ref 0.0–1.2)
CO2: 22 mmol/L (ref 20–29)
Calcium: 10.1 mg/dL (ref 8.7–10.2)
Chloride: 102 mmol/L (ref 96–106)
Creatinine, Ser: 1.61 mg/dL — ABNORMAL HIGH (ref 0.76–1.27)
Globulin, Total: 2.5 g/dL (ref 1.5–4.5)
Glucose: 74 mg/dL (ref 70–99)
Potassium: 4.4 mmol/L (ref 3.5–5.2)
Sodium: 142 mmol/L (ref 134–144)
Total Protein: 7.2 g/dL (ref 6.0–8.5)
eGFR: 60 mL/min/1.73 (ref >59)

## 2024-11-05 LAB — LIPID PANEL
Chol/HDL Ratio: 4.7 ratio (ref 0.0–5.0)
Cholesterol, Total: 200 mg/dL — ABNORMAL HIGH (ref 100–199)
HDL: 43 mg/dL (ref >39)
LDL Chol Calc (NIH): 133 mg/dL — ABNORMAL HIGH (ref 0–99)
Triglycerides: 132 mg/dL (ref 0–149)
VLDL Cholesterol Cal: 24 mg/dL (ref 5–40)

## 2024-11-05 LAB — CBC
Hematocrit: 46.4 % (ref 37.5–51.0)
Hemoglobin: 14.9 g/dL (ref 13.0–17.7)
MCH: 27.8 pg (ref 26.6–33.0)
MCHC: 32.1 g/dL (ref 31.5–35.7)
MCV: 87 fL (ref 79–97)
Platelets: 232 x10E3/uL (ref 150–450)
RBC: 5.36 x10E6/uL (ref 4.14–5.80)
RDW: 12.5 % (ref 11.6–15.4)
WBC: 5.2 x10E3/uL (ref 3.4–10.8)

## 2024-11-05 LAB — HEMOGLOBIN A1C
Est. average glucose Bld gHb Est-mCnc: 123 mg/dL
Hgb A1c MFr Bld: 5.9 % — ABNORMAL HIGH (ref 4.8–5.6)

## 2024-11-05 LAB — TSH: TSH: 1 u[IU]/mL (ref 0.450–4.500)

## 2024-12-10 ENCOUNTER — Other Ambulatory Visit: Payer: Self-pay | Admitting: Family

## 2024-12-10 DIAGNOSIS — M549 Dorsalgia, unspecified: Secondary | ICD-10-CM

## 2024-12-10 NOTE — Telephone Encounter (Signed)
 Complete

## 2024-12-24 ENCOUNTER — Ambulatory Visit: Payer: Self-pay

## 2024-12-24 NOTE — Telephone Encounter (Signed)
 FYI Only or Action Required?: FYI only for provider: appointment scheduled on 01/07/25.  Patient was last seen in primary care on 11/04/2024 by Jaycee Greig PARAS, NP.  Called Nurse Triage reporting Depression.  Symptoms began several months ago.  Interventions attempted: Nothing.  Symptoms are: gradually worsening.  Triage Disposition: See Physician Within 24 Hours  Patient/caregiver understands and will follow disposition?: Yes        Copied from CRM #[id number]. Topic: Clinical - Red Word Triage >> Dec 24, 2024  3:02 PM Charlet HERO wrote: Red Word that prompted transfer to Nurse Triage: Patient Grandmother Zelda is calling bc patient is having mental issue depression and outburst of anger. Anxious and then mood swings to rage. Amy Jaycee Reason for Disposition  Symptoms interfere with work or school  Answer Assessment - Initial Assessment Questions 1. CONCERN: What happened that made you call today?    Patient's grandmother called in to triage with complaints of increased depression, anger issues with the patient. Patient is not with grandmother during triage call.  This has been ongoing for the last few months.  He is not on any medications per the grandmother.  No thoughts of harming himself or others. Grandma states patient needs to be seen by PCP. No hx. Of depression/anxiety on file.   Appointment scheduled for further evaluation; and agrees with the plan of care, and will reach out if symptoms worsen or persist.  Protocols used: Depression-A-AH

## 2024-12-26 NOTE — Telephone Encounter (Signed)
 FYI Appt scheduled  Patient's grandmother called in to triage with complaints of increased depression, anger issues with the patient. Patient is not with grandmother during triage call.  This has been ongoing for the last few months.  He is not on any medications per the grandmother.  No thoughts of harming himself or others. Grandma states patient needs to be seen by PCP. No hx. Of depression/anxiety on file.

## 2024-12-26 NOTE — Telephone Encounter (Signed)
 Report to the Emergency Department/Urgent Care/call 911 for immediate medical evaluation. Follow-up with Primary Care.

## 2025-01-07 ENCOUNTER — Ambulatory Visit (INDEPENDENT_AMBULATORY_CARE_PROVIDER_SITE_OTHER): Payer: Self-pay | Admitting: Family

## 2025-01-07 ENCOUNTER — Encounter: Payer: Self-pay | Admitting: Family

## 2025-01-07 VITALS — BP 131/83 | HR 107 | Temp 98.5°F | Resp 16 | Ht 68.0 in | Wt 257.0 lb

## 2025-01-07 DIAGNOSIS — Z13220 Encounter for screening for lipoid disorders: Secondary | ICD-10-CM

## 2025-01-07 DIAGNOSIS — F419 Anxiety disorder, unspecified: Secondary | ICD-10-CM

## 2025-01-07 DIAGNOSIS — M62838 Other muscle spasm: Secondary | ICD-10-CM

## 2025-01-07 DIAGNOSIS — F32A Depression, unspecified: Secondary | ICD-10-CM

## 2025-01-07 MED ORDER — CYCLOBENZAPRINE HCL 5 MG PO TABS: 5.0000 mg | ORAL_TABLET | Freq: Three times a day (TID) | ORAL | 1 refills | Status: AC | PRN

## 2025-01-07 MED ORDER — ESCITALOPRAM OXALATE 5 MG PO TABS: 5.0000 mg | ORAL_TABLET | Freq: Every day | ORAL | 0 refills | Status: AC

## 2025-01-07 MED ORDER — HYDROXYZINE PAMOATE 25 MG PO CAPS: 25.0000 mg | ORAL_CAPSULE | Freq: Three times a day (TID) | ORAL | 1 refills | Status: DC | PRN

## 2025-01-07 NOTE — Progress Notes (Signed)
 "   Patient ID: Chris Nelson, male    DOB: [birth year]  MRN: [id number]  CC: Anxiety Depression   Subjective: Chris Nelson is a 29 y.o. male who presents for anxiety depression.   His concerns today include:  - States anxiety depression primarily related to living in a house with 7 people which is making him upset. In the past he did well on Escitalopram  and Hydroxyzine  and would like to try again. Would like referral to therapist. He denies thoughts of self-harm, suicidal ideations, homicidal ideations. - States he was helping someone move furniture and since then feels like he pulled a muscle in right lower extremity. Denies red flag symptoms.  - Cholesterol lab.   Patient Active Problem List   Diagnosis Date Noted   Vitamin D  deficiency 07/26/2023   Prediabetes 09/13/2021     Medications Ordered Prior to Encounter[1]  Allergies[2]  Social History   Socioeconomic History   Marital status: Single    Spouse name: Not on file   Number of children: Not on file   Years of education: Not on file   Highest education level: Not on file  Occupational History   Not on file  Tobacco Use   Smoking status: Never    Passive exposure: Never   Smokeless tobacco: Never  Vaping Use   Vaping status: Never Used  Substance and Sexual Activity   Alcohol use: Not Currently   Drug use: Never   Sexual activity: Not on file  Other Topics Concern   Not on file  Social History Narrative   Not on file   Social Drivers of Health   Tobacco Use: Low Risk (01/07/2025)   Patient History    Smoking Tobacco Use: Never    Smokeless Tobacco Use: Never    Passive Exposure: Never  Financial Resource Strain: Low Risk (11/04/2024)   Overall Financial Resource Strain (CARDIA)    Difficulty of Paying Living Expenses: Not hard at all  Food Insecurity: No Food Insecurity (11/04/2024)   Epic    Worried About Programme Researcher, Broadcasting/film/video in the Last Year: Never true    Ran Out of Food in the Last Year: Never  true  Transportation Needs: No Transportation Needs (11/05/2023)   PRAPARE - Administrator, Civil Service (Medical): No    Lack of Transportation (Non-Medical): No  Physical Activity: Sufficiently Active (11/04/2024)   Exercise Vital Sign    Days of Exercise per Week: 7 days    Minutes of Exercise per Session: 60 min  Stress: No Stress Concern Present (11/04/2024)   [employer] of Occupational Health - Occupational Stress Questionnaire    Feeling of Stress: Not at all  Social Connections: Moderately Integrated (11/04/2024)   Social Connection and Isolation Panel    Frequency of Communication with Friends and Family: Three times a week    Frequency of Social Gatherings with Friends and Family: Three times a week    Attends Religious Services: More than 4 times per year    Active Member of Clubs or Organizations: Yes    Attends Banker Meetings: More than 4 times per year    Marital Status: Never married  Intimate Partner Violence: Not At Risk (11/04/2024)   Epic    Fear of Current or Ex-Partner: No    Emotionally Abused: No    Physically Abused: No    Sexually Abused: No  Depression (PHQ2-9): Medium Risk (01/07/2025)   Depression (PHQ2-9)    PHQ-2 Score:  8  Alcohol Screen: Low Risk (11/04/2024)   Alcohol Screen    Last Alcohol Screening Score (AUDIT): 0  Housing: Unknown (11/04/2024)   Epic    Unable to Pay for Housing in the Last Year: No    Number of Times Moved in the Last Year: Not on file    Homeless in the Last Year: No  Utilities: Not At Risk (11/04/2024)   Epic    Threatened with loss of utilities: No  Health Literacy: Adequate Health Literacy (11/04/2024)   B1300 Health Literacy    Frequency of need for help with medical instructions: Never    Family History  Problem Relation Age of Onset   Hypertension Mother     Past Surgical History:  Procedure Laterality Date   MASS EXCISION Right 01/31/2022   Procedure: EXCISION RIGHT BACK  MASS;  Surgeon: Marene Sieving, MD;  Location: [facility] OR;  Service: Plastics;  Laterality: Right;  45 minutes    ROS: Review of Systems Negative except as stated above  PHYSICAL EXAM: BP 131/83   Pulse (!) 107   Temp 98.5 F (36.9 C) (Oral)   Resp 16   Ht 5' 8 (1.727 m)   Wt 257 lb (116.6 kg)   SpO2 94%   BMI 39.08 kg/m   Physical Exam HENT:     Head: Normocephalic and atraumatic.     Nose: Nose normal.     Mouth/Throat:     Mouth: Mucous membranes are moist.     Pharynx: Oropharynx is clear.  Eyes:     Extraocular Movements: Extraocular movements intact.     Conjunctiva/sclera: Conjunctivae normal.     Pupils: Pupils are equal, round, and reactive to light.  Cardiovascular:     Rate and Rhythm: Tachycardia present.     Pulses: Normal pulses.     Heart sounds: Normal heart sounds.  Pulmonary:     Effort: Pulmonary effort is normal.     Breath sounds: Normal breath sounds.  Musculoskeletal:        General: Normal range of motion.     Cervical back: Normal range of motion and neck supple.     Right hip: Normal.     Left hip: Normal.     Right upper leg: Normal.     Left upper leg: Normal.     Right knee: Normal.     Left knee: Normal.     Right lower leg: Normal.     Left lower leg: Normal.     Right ankle: Normal.     Left ankle: Normal.     Right foot: Normal.     Left foot: Normal.  Neurological:     General: No focal deficit present.     Mental Status: He is alert and oriented to person, place, and time.  Psychiatric:        Mood and Affect: Mood normal.        Behavior: Behavior normal.    ASSESSMENT AND PLAN: 1. Anxiety and depression (Primary) - Patient denies thoughts of self-harm, suicidal ideations, homicidal ideations. - Escitalopram  and Hydroxyzine  as prescribed. Counseled on medication adherence/adverse effects. - Referral to Psychiatry for evaluation/management.  - Follow-up with primary provider in 4 weeks or sooner if needed.  -  escitalopram  (LEXAPRO ) 5 MG tablet; Take 1 tablet (5 mg total) by mouth daily.  Dispense: 90 tablet; Refill: 0 - hydrOXYzine  (VISTARIL ) 25 MG capsule; Take 1 capsule (25 mg total) by mouth every 8 (eight) hours as needed.  Dispense: 90 capsule; Refill: 1 - Ambulatory referral to Psychiatry  2. Muscle spasm - Cyclobenzaprine  as prescribed. Counseled on medication adherence/adverse effects.  - Follow-up with primary provider in 4 weeks or sooner if  needed.  - cyclobenzaprine  (FLEXERIL ) 5 MG tablet; Take 1 tablet (5 mg total) by mouth 3 (three) times daily as needed for muscle spasms.  Dispense: 90 tablet; Refill: 1  3. Screening cholesterol level - Routine screening.  - Lipid panel; Future  Patient was given the opportunity to ask questions.  Patient verbalized understanding of the plan and was able to repeat key elements of the plan. Patient was given clear instructions to go to Emergency Department or return to medical center if symptoms don't improve, worsen, or new problems develop.The patient verbalized understanding.   Orders Placed This Encounter  Procedures   Lipid panel   Ambulatory referral to Psychiatry     Requested Prescriptions   Signed Prescriptions Disp Refills   escitalopram  (LEXAPRO ) 5 MG tablet 90 tablet 0    Sig: Take 1 tablet (5 mg total) by mouth daily.   hydrOXYzine  (VISTARIL ) 25 MG capsule 90 capsule 1    Sig: Take 1 capsule (25 mg total) by mouth every 8 (eight) hours as needed.   cyclobenzaprine  (FLEXERIL ) 5 MG tablet 90 tablet 1    Sig: Take 1 tablet (5 mg total) by mouth 3 (three) times daily as needed for muscle spasms.    Return in 4 weeks (on 02/04/2025) for Follow-Up or next available chronic conditions.  Greig JINNY Chute, NP      [1]  Current Outpatient Medications on File Prior to Visit  Medication Sig Dispense Refill   atorvastatin  (LIPITOR) 20 MG tablet TAKE 1 TABLET BY MOUTH EVERY DAY 90 tablet 0   meloxicam  (MOBIC ) 15 MG tablet TAKE 1 TABLET  (15 MG TOTAL) BY MOUTH DAILY. 30 tablet 2   SUMAtriptan  (IMITREX ) 25 MG tablet Take 25 mg (1 tablet total) by mouth at the start of the headache. May repeat in 2 hours x 1 if headache persists. Max of 2 tablets/24 hours. 30 tablet 1   No current facility-administered medications on file prior to visit.  [2] No Known Allergies  "

## 2025-01-08 ENCOUNTER — Ambulatory Visit: Payer: Self-pay | Admitting: Family

## 2025-01-08 DIAGNOSIS — E785 Hyperlipidemia, unspecified: Secondary | ICD-10-CM

## 2025-01-08 LAB — LIPID PANEL
Chol/HDL Ratio: 4.9 ratio (ref 0.0–5.0)
Cholesterol, Total: 182 mg/dL (ref 100–199)
HDL: 37 mg/dL — ABNORMAL LOW
LDL Chol Calc (NIH): 93 mg/dL (ref 0–99)
Triglycerides: 312 mg/dL — ABNORMAL HIGH (ref 0–149)
VLDL Cholesterol Cal: 52 mg/dL — ABNORMAL HIGH (ref 5–40)

## 2025-01-08 MED ORDER — ATORVASTATIN CALCIUM 20 MG PO TABS: 20.0000 mg | ORAL_TABLET | Freq: Every day | ORAL | 0 refills | Status: AC

## 2025-01-26 ENCOUNTER — Ambulatory Visit (HOSPITAL_COMMUNITY): Admitting: Licensed Clinical Social Worker

## 2025-01-29 ENCOUNTER — Other Ambulatory Visit: Payer: Self-pay | Admitting: Family

## 2025-01-29 DIAGNOSIS — F419 Anxiety disorder, unspecified: Secondary | ICD-10-CM

## 2025-01-29 NOTE — Telephone Encounter (Signed)
 Last OV 01/07/25. Please advise

## 2025-01-30 NOTE — Telephone Encounter (Signed)
 Complete

## 2025-02-05 ENCOUNTER — Ambulatory Visit: Payer: Self-pay | Admitting: Family

## 2025-02-19 ENCOUNTER — Ambulatory Visit (HOSPITAL_COMMUNITY): Admitting: Licensed Clinical Social Worker

## 2025-11-10 ENCOUNTER — Encounter: Admitting: Family
